# Patient Record
Sex: Male | Born: 1940 | Race: White | Hispanic: No | Marital: Married | Smoking: Former smoker
Health system: Southern US, Community
[De-identification: ages and names within clinical notes are randomized; demographics above are authoritative.]

## PROBLEM LIST (undated history)

## (undated) DIAGNOSIS — F32A Depression, unspecified: Secondary | ICD-10-CM

## (undated) DIAGNOSIS — N189 Chronic kidney disease, unspecified: Secondary | ICD-10-CM

## (undated) DIAGNOSIS — K912 Postsurgical malabsorption, not elsewhere classified: Secondary | ICD-10-CM

## (undated) DIAGNOSIS — N390 Urinary tract infection, site not specified: Secondary | ICD-10-CM

## (undated) DIAGNOSIS — J449 Chronic obstructive pulmonary disease, unspecified: Secondary | ICD-10-CM

## (undated) DIAGNOSIS — E876 Hypokalemia: Secondary | ICD-10-CM

## (undated) DIAGNOSIS — F0390 Unspecified dementia without behavioral disturbance: Secondary | ICD-10-CM

## (undated) DIAGNOSIS — Z85038 Personal history of other malignant neoplasm of large intestine: Secondary | ICD-10-CM

## (undated) DIAGNOSIS — N179 Acute kidney failure, unspecified: Secondary | ICD-10-CM

## (undated) DIAGNOSIS — F329 Major depressive disorder, single episode, unspecified: Secondary | ICD-10-CM

## (undated) HISTORY — DX: Chronic obstructive pulmonary disease, unspecified: J44.9

## (undated) HISTORY — DX: Depression, unspecified: F32.A

## (undated) HISTORY — DX: Hypokalemia: E87.6

## (undated) HISTORY — DX: Major depressive disorder, single episode, unspecified: F32.9

## (undated) HISTORY — DX: Postsurgical malabsorption, not elsewhere classified: K91.2

## (undated) HISTORY — PX: INGUINAL HERNIA REPAIR: SHX194

## (undated) HISTORY — DX: Chronic kidney disease, unspecified: N18.9

## (undated) HISTORY — PX: HEMICOLECTOMY: SHX854

## (undated) HISTORY — PX: SHOULDER SURGERY: SHX246

## (undated) HISTORY — DX: Acute kidney failure, unspecified: N17.9

## (undated) HISTORY — DX: Personal history of other malignant neoplasm of large intestine: Z85.038

## (undated) HISTORY — PX: PORTACATH PLACEMENT: SHX2246

## (undated) HISTORY — PX: OTHER SURGICAL HISTORY: SHX169

## (undated) HISTORY — DX: Urinary tract infection, site not specified: N39.0

## (undated) HISTORY — DX: Unspecified dementia, unspecified severity, without behavioral disturbance, psychotic disturbance, mood disturbance, and anxiety: F03.90

---

## 2015-08-23 DIAGNOSIS — K591 Functional diarrhea: Secondary | ICD-10-CM | POA: Diagnosis not present

## 2015-08-23 DIAGNOSIS — C189 Malignant neoplasm of colon, unspecified: Secondary | ICD-10-CM | POA: Diagnosis not present

## 2015-09-07 DIAGNOSIS — Z933 Colostomy status: Secondary | ICD-10-CM | POA: Diagnosis not present

## 2015-09-27 DIAGNOSIS — Z9181 History of falling: Secondary | ICD-10-CM | POA: Diagnosis not present

## 2015-09-27 DIAGNOSIS — J42 Unspecified chronic bronchitis: Secondary | ICD-10-CM | POA: Diagnosis not present

## 2015-09-27 DIAGNOSIS — Z1389 Encounter for screening for other disorder: Secondary | ICD-10-CM | POA: Diagnosis not present

## 2015-09-27 DIAGNOSIS — M549 Dorsalgia, unspecified: Secondary | ICD-10-CM | POA: Diagnosis not present

## 2015-10-23 DIAGNOSIS — J209 Acute bronchitis, unspecified: Secondary | ICD-10-CM | POA: Diagnosis not present

## 2015-10-24 DIAGNOSIS — R0600 Dyspnea, unspecified: Secondary | ICD-10-CM | POA: Diagnosis not present

## 2015-10-24 DIAGNOSIS — R1110 Vomiting, unspecified: Secondary | ICD-10-CM | POA: Diagnosis not present

## 2015-10-24 DIAGNOSIS — E860 Dehydration: Secondary | ICD-10-CM | POA: Diagnosis not present

## 2015-11-01 DIAGNOSIS — Z933 Colostomy status: Secondary | ICD-10-CM | POA: Diagnosis not present

## 2015-12-25 DIAGNOSIS — M549 Dorsalgia, unspecified: Secondary | ICD-10-CM | POA: Diagnosis not present

## 2015-12-30 DIAGNOSIS — K5900 Constipation, unspecified: Secondary | ICD-10-CM | POA: Diagnosis not present

## 2015-12-30 DIAGNOSIS — N201 Calculus of ureter: Secondary | ICD-10-CM | POA: Diagnosis not present

## 2015-12-30 DIAGNOSIS — N132 Hydronephrosis with renal and ureteral calculous obstruction: Secondary | ICD-10-CM | POA: Diagnosis not present

## 2015-12-30 DIAGNOSIS — Z933 Colostomy status: Secondary | ICD-10-CM | POA: Diagnosis not present

## 2016-01-05 DIAGNOSIS — Z933 Colostomy status: Secondary | ICD-10-CM | POA: Diagnosis not present

## 2016-01-05 DIAGNOSIS — Z9181 History of falling: Secondary | ICD-10-CM | POA: Diagnosis not present

## 2016-01-05 DIAGNOSIS — N200 Calculus of kidney: Secondary | ICD-10-CM | POA: Diagnosis not present

## 2016-01-31 DIAGNOSIS — J069 Acute upper respiratory infection, unspecified: Secondary | ICD-10-CM | POA: Diagnosis not present

## 2016-01-31 DIAGNOSIS — R05 Cough: Secondary | ICD-10-CM | POA: Diagnosis not present

## 2016-02-09 DIAGNOSIS — R079 Chest pain, unspecified: Secondary | ICD-10-CM | POA: Diagnosis not present

## 2016-02-09 DIAGNOSIS — N289 Disorder of kidney and ureter, unspecified: Secondary | ICD-10-CM | POA: Diagnosis not present

## 2016-02-09 DIAGNOSIS — R0602 Shortness of breath: Secondary | ICD-10-CM | POA: Diagnosis not present

## 2016-02-09 DIAGNOSIS — G252 Other specified forms of tremor: Secondary | ICD-10-CM | POA: Diagnosis not present

## 2016-02-09 DIAGNOSIS — R251 Tremor, unspecified: Secondary | ICD-10-CM | POA: Diagnosis not present

## 2016-02-10 DIAGNOSIS — N189 Chronic kidney disease, unspecified: Secondary | ICD-10-CM | POA: Diagnosis not present

## 2016-02-10 DIAGNOSIS — Z23 Encounter for immunization: Secondary | ICD-10-CM | POA: Diagnosis not present

## 2016-02-10 DIAGNOSIS — N179 Acute kidney failure, unspecified: Secondary | ICD-10-CM | POA: Diagnosis not present

## 2016-02-13 DIAGNOSIS — R338 Other retention of urine: Secondary | ICD-10-CM | POA: Diagnosis not present

## 2016-02-13 DIAGNOSIS — N3000 Acute cystitis without hematuria: Secondary | ICD-10-CM | POA: Diagnosis not present

## 2016-02-15 DIAGNOSIS — G301 Alzheimer's disease with late onset: Secondary | ICD-10-CM | POA: Diagnosis not present

## 2016-02-16 DIAGNOSIS — N179 Acute kidney failure, unspecified: Secondary | ICD-10-CM | POA: Diagnosis not present

## 2016-02-20 DIAGNOSIS — N401 Enlarged prostate with lower urinary tract symptoms: Secondary | ICD-10-CM | POA: Diagnosis not present

## 2016-02-20 DIAGNOSIS — N3020 Other chronic cystitis without hematuria: Secondary | ICD-10-CM | POA: Diagnosis not present

## 2016-02-23 DIAGNOSIS — R338 Other retention of urine: Secondary | ICD-10-CM | POA: Diagnosis not present

## 2016-02-23 DIAGNOSIS — Z23 Encounter for immunization: Secondary | ICD-10-CM | POA: Diagnosis not present

## 2016-02-23 DIAGNOSIS — N410 Acute prostatitis: Secondary | ICD-10-CM | POA: Diagnosis not present

## 2016-02-23 DIAGNOSIS — N401 Enlarged prostate with lower urinary tract symptoms: Secondary | ICD-10-CM | POA: Diagnosis not present

## 2016-02-23 DIAGNOSIS — J45909 Unspecified asthma, uncomplicated: Secondary | ICD-10-CM | POA: Diagnosis not present

## 2016-02-24 DIAGNOSIS — Z23 Encounter for immunization: Secondary | ICD-10-CM | POA: Diagnosis not present

## 2016-02-24 DIAGNOSIS — R338 Other retention of urine: Secondary | ICD-10-CM | POA: Diagnosis not present

## 2016-02-24 DIAGNOSIS — N401 Enlarged prostate with lower urinary tract symptoms: Secondary | ICD-10-CM | POA: Diagnosis not present

## 2016-02-24 DIAGNOSIS — J45909 Unspecified asthma, uncomplicated: Secondary | ICD-10-CM | POA: Diagnosis not present

## 2016-02-27 DIAGNOSIS — R338 Other retention of urine: Secondary | ICD-10-CM | POA: Diagnosis not present

## 2016-03-01 DIAGNOSIS — J40 Bronchitis, not specified as acute or chronic: Secondary | ICD-10-CM | POA: Diagnosis not present

## 2016-03-05 DIAGNOSIS — R338 Other retention of urine: Secondary | ICD-10-CM | POA: Diagnosis not present

## 2016-03-05 DIAGNOSIS — N3090 Cystitis, unspecified without hematuria: Secondary | ICD-10-CM | POA: Diagnosis not present

## 2016-03-06 DIAGNOSIS — Z933 Colostomy status: Secondary | ICD-10-CM | POA: Diagnosis not present

## 2016-03-19 DIAGNOSIS — N401 Enlarged prostate with lower urinary tract symptoms: Secondary | ICD-10-CM | POA: Diagnosis not present

## 2016-03-19 DIAGNOSIS — N3000 Acute cystitis without hematuria: Secondary | ICD-10-CM | POA: Diagnosis not present

## 2016-04-17 DIAGNOSIS — R338 Other retention of urine: Secondary | ICD-10-CM | POA: Diagnosis not present

## 2016-05-08 DIAGNOSIS — Z0000 Encounter for general adult medical examination without abnormal findings: Secondary | ICD-10-CM | POA: Diagnosis not present

## 2016-05-08 DIAGNOSIS — R5381 Other malaise: Secondary | ICD-10-CM | POA: Diagnosis not present

## 2016-05-08 DIAGNOSIS — R5383 Other fatigue: Secondary | ICD-10-CM | POA: Diagnosis not present

## 2016-05-08 DIAGNOSIS — R110 Nausea: Secondary | ICD-10-CM | POA: Diagnosis not present

## 2016-06-20 DIAGNOSIS — R110 Nausea: Secondary | ICD-10-CM | POA: Diagnosis not present

## 2016-06-20 DIAGNOSIS — K912 Postsurgical malabsorption, not elsewhere classified: Secondary | ICD-10-CM | POA: Diagnosis not present

## 2016-06-21 DIAGNOSIS — Z85038 Personal history of other malignant neoplasm of large intestine: Secondary | ICD-10-CM | POA: Diagnosis not present

## 2016-06-21 DIAGNOSIS — K912 Postsurgical malabsorption, not elsewhere classified: Secondary | ICD-10-CM | POA: Diagnosis not present

## 2016-06-25 DIAGNOSIS — Z933 Colostomy status: Secondary | ICD-10-CM | POA: Diagnosis not present

## 2016-06-26 DIAGNOSIS — R1111 Vomiting without nausea: Secondary | ICD-10-CM | POA: Diagnosis not present

## 2016-06-26 DIAGNOSIS — J439 Emphysema, unspecified: Secondary | ICD-10-CM | POA: Diagnosis not present

## 2016-06-26 DIAGNOSIS — K5730 Diverticulosis of large intestine without perforation or abscess without bleeding: Secondary | ICD-10-CM | POA: Diagnosis not present

## 2016-06-26 DIAGNOSIS — R100 Acute abdomen: Secondary | ICD-10-CM | POA: Diagnosis not present

## 2016-06-26 DIAGNOSIS — I700 Atherosclerosis of aorta: Secondary | ICD-10-CM | POA: Diagnosis not present

## 2016-06-26 DIAGNOSIS — Z85038 Personal history of other malignant neoplasm of large intestine: Secondary | ICD-10-CM | POA: Diagnosis not present

## 2016-06-28 DIAGNOSIS — E291 Testicular hypofunction: Secondary | ICD-10-CM | POA: Diagnosis not present

## 2016-06-28 DIAGNOSIS — N3000 Acute cystitis without hematuria: Secondary | ICD-10-CM | POA: Diagnosis not present

## 2016-06-28 DIAGNOSIS — N401 Enlarged prostate with lower urinary tract symptoms: Secondary | ICD-10-CM | POA: Diagnosis not present

## 2016-06-28 DIAGNOSIS — R338 Other retention of urine: Secondary | ICD-10-CM | POA: Diagnosis not present

## 2016-06-28 DIAGNOSIS — N3020 Other chronic cystitis without hematuria: Secondary | ICD-10-CM | POA: Diagnosis not present

## 2016-07-12 DIAGNOSIS — N318 Other neuromuscular dysfunction of bladder: Secondary | ICD-10-CM | POA: Diagnosis not present

## 2016-07-12 DIAGNOSIS — N3020 Other chronic cystitis without hematuria: Secondary | ICD-10-CM | POA: Diagnosis not present

## 2016-07-12 DIAGNOSIS — N401 Enlarged prostate with lower urinary tract symptoms: Secondary | ICD-10-CM | POA: Diagnosis not present

## 2016-07-15 DIAGNOSIS — E878 Other disorders of electrolyte and fluid balance, not elsewhere classified: Secondary | ICD-10-CM | POA: Diagnosis not present

## 2016-07-15 DIAGNOSIS — Z452 Encounter for adjustment and management of vascular access device: Secondary | ICD-10-CM | POA: Diagnosis not present

## 2016-07-15 DIAGNOSIS — R0602 Shortness of breath: Secondary | ICD-10-CM | POA: Diagnosis not present

## 2016-07-15 DIAGNOSIS — E872 Acidosis: Secondary | ICD-10-CM | POA: Diagnosis not present

## 2016-07-15 DIAGNOSIS — N390 Urinary tract infection, site not specified: Secondary | ICD-10-CM | POA: Diagnosis not present

## 2016-07-15 DIAGNOSIS — Z79899 Other long term (current) drug therapy: Secondary | ICD-10-CM | POA: Diagnosis not present

## 2016-07-15 DIAGNOSIS — R918 Other nonspecific abnormal finding of lung field: Secondary | ICD-10-CM | POA: Diagnosis not present

## 2016-07-15 DIAGNOSIS — N189 Chronic kidney disease, unspecified: Secondary | ICD-10-CM | POA: Diagnosis not present

## 2016-07-15 DIAGNOSIS — Z85038 Personal history of other malignant neoplasm of large intestine: Secondary | ICD-10-CM | POA: Diagnosis not present

## 2016-07-15 DIAGNOSIS — J449 Chronic obstructive pulmonary disease, unspecified: Secondary | ICD-10-CM | POA: Diagnosis not present

## 2016-07-15 DIAGNOSIS — E860 Dehydration: Secondary | ICD-10-CM | POA: Diagnosis not present

## 2016-07-15 DIAGNOSIS — Z87891 Personal history of nicotine dependence: Secondary | ICD-10-CM | POA: Diagnosis not present

## 2016-07-15 DIAGNOSIS — E876 Hypokalemia: Secondary | ICD-10-CM | POA: Diagnosis not present

## 2016-07-15 DIAGNOSIS — K912 Postsurgical malabsorption, not elsewhere classified: Secondary | ICD-10-CM

## 2016-07-15 DIAGNOSIS — F329 Major depressive disorder, single episode, unspecified: Secondary | ICD-10-CM | POA: Diagnosis not present

## 2016-07-15 DIAGNOSIS — N289 Disorder of kidney and ureter, unspecified: Secondary | ICD-10-CM | POA: Diagnosis not present

## 2016-07-15 DIAGNOSIS — N179 Acute kidney failure, unspecified: Secondary | ICD-10-CM | POA: Diagnosis not present

## 2016-07-15 DIAGNOSIS — F0390 Unspecified dementia without behavioral disturbance: Secondary | ICD-10-CM | POA: Diagnosis not present

## 2016-07-16 DIAGNOSIS — R0602 Shortness of breath: Secondary | ICD-10-CM | POA: Diagnosis not present

## 2016-07-18 DIAGNOSIS — E876 Hypokalemia: Secondary | ICD-10-CM | POA: Diagnosis not present

## 2016-07-18 DIAGNOSIS — N179 Acute kidney failure, unspecified: Secondary | ICD-10-CM | POA: Diagnosis not present

## 2016-07-18 DIAGNOSIS — F0390 Unspecified dementia without behavioral disturbance: Secondary | ICD-10-CM | POA: Diagnosis not present

## 2016-07-18 DIAGNOSIS — F329 Major depressive disorder, single episode, unspecified: Secondary | ICD-10-CM | POA: Diagnosis not present

## 2016-07-18 DIAGNOSIS — N390 Urinary tract infection, site not specified: Secondary | ICD-10-CM | POA: Diagnosis not present

## 2016-07-18 DIAGNOSIS — E872 Acidosis: Secondary | ICD-10-CM | POA: Diagnosis not present

## 2016-07-18 DIAGNOSIS — N289 Disorder of kidney and ureter, unspecified: Secondary | ICD-10-CM | POA: Diagnosis not present

## 2016-07-20 DIAGNOSIS — E860 Dehydration: Secondary | ICD-10-CM | POA: Diagnosis not present

## 2016-07-21 DIAGNOSIS — N189 Chronic kidney disease, unspecified: Secondary | ICD-10-CM | POA: Diagnosis not present

## 2016-07-21 DIAGNOSIS — N179 Acute kidney failure, unspecified: Secondary | ICD-10-CM | POA: Diagnosis not present

## 2016-07-21 DIAGNOSIS — J449 Chronic obstructive pulmonary disease, unspecified: Secondary | ICD-10-CM | POA: Diagnosis not present

## 2016-07-21 DIAGNOSIS — J45909 Unspecified asthma, uncomplicated: Secondary | ICD-10-CM | POA: Diagnosis not present

## 2016-07-21 DIAGNOSIS — Z85038 Personal history of other malignant neoplasm of large intestine: Secondary | ICD-10-CM | POA: Diagnosis not present

## 2016-07-21 DIAGNOSIS — K912 Postsurgical malabsorption, not elsewhere classified: Secondary | ICD-10-CM | POA: Diagnosis not present

## 2016-07-21 DIAGNOSIS — E876 Hypokalemia: Secondary | ICD-10-CM | POA: Diagnosis not present

## 2016-07-21 DIAGNOSIS — N289 Disorder of kidney and ureter, unspecified: Secondary | ICD-10-CM | POA: Diagnosis not present

## 2016-07-21 DIAGNOSIS — Z932 Ileostomy status: Secondary | ICD-10-CM | POA: Diagnosis not present

## 2016-07-21 DIAGNOSIS — E872 Acidosis: Secondary | ICD-10-CM | POA: Diagnosis not present

## 2016-07-21 DIAGNOSIS — Z452 Encounter for adjustment and management of vascular access device: Secondary | ICD-10-CM | POA: Diagnosis not present

## 2016-07-21 DIAGNOSIS — I129 Hypertensive chronic kidney disease with stage 1 through stage 4 chronic kidney disease, or unspecified chronic kidney disease: Secondary | ICD-10-CM | POA: Diagnosis not present

## 2016-07-25 DIAGNOSIS — Z932 Ileostomy status: Secondary | ICD-10-CM | POA: Diagnosis not present

## 2016-07-25 DIAGNOSIS — Z452 Encounter for adjustment and management of vascular access device: Secondary | ICD-10-CM | POA: Diagnosis not present

## 2016-07-25 DIAGNOSIS — N189 Chronic kidney disease, unspecified: Secondary | ICD-10-CM | POA: Diagnosis not present

## 2016-07-25 DIAGNOSIS — E876 Hypokalemia: Secondary | ICD-10-CM | POA: Diagnosis not present

## 2016-07-25 DIAGNOSIS — I129 Hypertensive chronic kidney disease with stage 1 through stage 4 chronic kidney disease, or unspecified chronic kidney disease: Secondary | ICD-10-CM | POA: Diagnosis not present

## 2016-07-25 DIAGNOSIS — Z85038 Personal history of other malignant neoplasm of large intestine: Secondary | ICD-10-CM | POA: Diagnosis not present

## 2016-07-25 DIAGNOSIS — J45909 Unspecified asthma, uncomplicated: Secondary | ICD-10-CM | POA: Diagnosis not present

## 2016-07-25 DIAGNOSIS — K912 Postsurgical malabsorption, not elsewhere classified: Secondary | ICD-10-CM | POA: Diagnosis not present

## 2016-07-25 DIAGNOSIS — J449 Chronic obstructive pulmonary disease, unspecified: Secondary | ICD-10-CM | POA: Diagnosis not present

## 2016-07-25 DIAGNOSIS — K9089 Other intestinal malabsorption: Secondary | ICD-10-CM | POA: Diagnosis not present

## 2016-07-27 DIAGNOSIS — Z932 Ileostomy status: Secondary | ICD-10-CM | POA: Diagnosis not present

## 2016-07-27 DIAGNOSIS — E876 Hypokalemia: Secondary | ICD-10-CM | POA: Diagnosis not present

## 2016-07-27 DIAGNOSIS — K912 Postsurgical malabsorption, not elsewhere classified: Secondary | ICD-10-CM | POA: Diagnosis not present

## 2016-07-27 DIAGNOSIS — J449 Chronic obstructive pulmonary disease, unspecified: Secondary | ICD-10-CM | POA: Diagnosis not present

## 2016-07-27 DIAGNOSIS — J45909 Unspecified asthma, uncomplicated: Secondary | ICD-10-CM | POA: Diagnosis not present

## 2016-07-27 DIAGNOSIS — N189 Chronic kidney disease, unspecified: Secondary | ICD-10-CM | POA: Diagnosis not present

## 2016-07-27 DIAGNOSIS — Z85038 Personal history of other malignant neoplasm of large intestine: Secondary | ICD-10-CM | POA: Diagnosis not present

## 2016-07-27 DIAGNOSIS — I129 Hypertensive chronic kidney disease with stage 1 through stage 4 chronic kidney disease, or unspecified chronic kidney disease: Secondary | ICD-10-CM | POA: Diagnosis not present

## 2016-07-27 DIAGNOSIS — J209 Acute bronchitis, unspecified: Secondary | ICD-10-CM | POA: Diagnosis not present

## 2016-07-27 DIAGNOSIS — Z452 Encounter for adjustment and management of vascular access device: Secondary | ICD-10-CM | POA: Diagnosis not present

## 2016-07-28 DIAGNOSIS — N289 Disorder of kidney and ureter, unspecified: Secondary | ICD-10-CM | POA: Diagnosis not present

## 2016-07-28 DIAGNOSIS — K912 Postsurgical malabsorption, not elsewhere classified: Secondary | ICD-10-CM | POA: Diagnosis not present

## 2016-07-28 DIAGNOSIS — E876 Hypokalemia: Secondary | ICD-10-CM | POA: Diagnosis not present

## 2016-07-28 DIAGNOSIS — E872 Acidosis: Secondary | ICD-10-CM | POA: Diagnosis not present

## 2016-07-28 DIAGNOSIS — N179 Acute kidney failure, unspecified: Secondary | ICD-10-CM | POA: Diagnosis not present

## 2016-07-30 DIAGNOSIS — E876 Hypokalemia: Secondary | ICD-10-CM | POA: Diagnosis not present

## 2016-07-30 DIAGNOSIS — I129 Hypertensive chronic kidney disease with stage 1 through stage 4 chronic kidney disease, or unspecified chronic kidney disease: Secondary | ICD-10-CM | POA: Diagnosis not present

## 2016-07-30 DIAGNOSIS — J449 Chronic obstructive pulmonary disease, unspecified: Secondary | ICD-10-CM | POA: Diagnosis not present

## 2016-07-30 DIAGNOSIS — Z85038 Personal history of other malignant neoplasm of large intestine: Secondary | ICD-10-CM | POA: Diagnosis not present

## 2016-07-30 DIAGNOSIS — J45909 Unspecified asthma, uncomplicated: Secondary | ICD-10-CM | POA: Diagnosis not present

## 2016-07-30 DIAGNOSIS — N189 Chronic kidney disease, unspecified: Secondary | ICD-10-CM | POA: Diagnosis not present

## 2016-07-30 DIAGNOSIS — Z932 Ileostomy status: Secondary | ICD-10-CM | POA: Diagnosis not present

## 2016-07-30 DIAGNOSIS — Z452 Encounter for adjustment and management of vascular access device: Secondary | ICD-10-CM | POA: Diagnosis not present

## 2016-07-30 DIAGNOSIS — K912 Postsurgical malabsorption, not elsewhere classified: Secondary | ICD-10-CM | POA: Diagnosis not present

## 2016-07-31 DIAGNOSIS — K912 Postsurgical malabsorption, not elsewhere classified: Secondary | ICD-10-CM | POA: Diagnosis not present

## 2016-07-31 DIAGNOSIS — E876 Hypokalemia: Secondary | ICD-10-CM | POA: Diagnosis not present

## 2016-07-31 DIAGNOSIS — N289 Disorder of kidney and ureter, unspecified: Secondary | ICD-10-CM | POA: Diagnosis not present

## 2016-08-02 DIAGNOSIS — J45909 Unspecified asthma, uncomplicated: Secondary | ICD-10-CM | POA: Diagnosis not present

## 2016-08-02 DIAGNOSIS — K912 Postsurgical malabsorption, not elsewhere classified: Secondary | ICD-10-CM | POA: Diagnosis not present

## 2016-08-02 DIAGNOSIS — J449 Chronic obstructive pulmonary disease, unspecified: Secondary | ICD-10-CM | POA: Diagnosis not present

## 2016-08-02 DIAGNOSIS — Z85038 Personal history of other malignant neoplasm of large intestine: Secondary | ICD-10-CM | POA: Diagnosis not present

## 2016-08-02 DIAGNOSIS — N189 Chronic kidney disease, unspecified: Secondary | ICD-10-CM | POA: Diagnosis not present

## 2016-08-02 DIAGNOSIS — E876 Hypokalemia: Secondary | ICD-10-CM | POA: Diagnosis not present

## 2016-08-02 DIAGNOSIS — Z932 Ileostomy status: Secondary | ICD-10-CM | POA: Diagnosis not present

## 2016-08-02 DIAGNOSIS — Z452 Encounter for adjustment and management of vascular access device: Secondary | ICD-10-CM | POA: Diagnosis not present

## 2016-08-02 DIAGNOSIS — I129 Hypertensive chronic kidney disease with stage 1 through stage 4 chronic kidney disease, or unspecified chronic kidney disease: Secondary | ICD-10-CM | POA: Diagnosis not present

## 2016-08-06 DIAGNOSIS — J449 Chronic obstructive pulmonary disease, unspecified: Secondary | ICD-10-CM | POA: Diagnosis not present

## 2016-08-06 DIAGNOSIS — Z85038 Personal history of other malignant neoplasm of large intestine: Secondary | ICD-10-CM | POA: Diagnosis not present

## 2016-08-06 DIAGNOSIS — Z932 Ileostomy status: Secondary | ICD-10-CM | POA: Diagnosis not present

## 2016-08-06 DIAGNOSIS — Z452 Encounter for adjustment and management of vascular access device: Secondary | ICD-10-CM | POA: Diagnosis not present

## 2016-08-06 DIAGNOSIS — E876 Hypokalemia: Secondary | ICD-10-CM | POA: Diagnosis not present

## 2016-08-06 DIAGNOSIS — K912 Postsurgical malabsorption, not elsewhere classified: Secondary | ICD-10-CM | POA: Diagnosis not present

## 2016-08-06 DIAGNOSIS — N189 Chronic kidney disease, unspecified: Secondary | ICD-10-CM | POA: Diagnosis not present

## 2016-08-06 DIAGNOSIS — J45909 Unspecified asthma, uncomplicated: Secondary | ICD-10-CM | POA: Diagnosis not present

## 2016-08-06 DIAGNOSIS — I129 Hypertensive chronic kidney disease with stage 1 through stage 4 chronic kidney disease, or unspecified chronic kidney disease: Secondary | ICD-10-CM | POA: Diagnosis not present

## 2016-08-13 DIAGNOSIS — K912 Postsurgical malabsorption, not elsewhere classified: Secondary | ICD-10-CM | POA: Diagnosis not present

## 2016-08-20 DIAGNOSIS — K912 Postsurgical malabsorption, not elsewhere classified: Secondary | ICD-10-CM | POA: Diagnosis not present

## 2016-08-27 DIAGNOSIS — K912 Postsurgical malabsorption, not elsewhere classified: Secondary | ICD-10-CM | POA: Diagnosis not present

## 2016-08-30 DIAGNOSIS — D649 Anemia, unspecified: Secondary | ICD-10-CM | POA: Diagnosis not present

## 2016-09-03 DIAGNOSIS — K912 Postsurgical malabsorption, not elsewhere classified: Secondary | ICD-10-CM | POA: Diagnosis not present

## 2016-09-10 DIAGNOSIS — K912 Postsurgical malabsorption, not elsewhere classified: Secondary | ICD-10-CM | POA: Diagnosis not present

## 2016-09-17 DIAGNOSIS — K912 Postsurgical malabsorption, not elsewhere classified: Secondary | ICD-10-CM | POA: Diagnosis not present

## 2016-09-21 DIAGNOSIS — Z933 Colostomy status: Secondary | ICD-10-CM | POA: Diagnosis not present

## 2016-09-24 DIAGNOSIS — K912 Postsurgical malabsorption, not elsewhere classified: Secondary | ICD-10-CM | POA: Diagnosis not present

## 2016-09-27 DIAGNOSIS — K912 Postsurgical malabsorption, not elsewhere classified: Secondary | ICD-10-CM | POA: Diagnosis not present

## 2016-10-01 DIAGNOSIS — K912 Postsurgical malabsorption, not elsewhere classified: Secondary | ICD-10-CM | POA: Diagnosis not present

## 2016-10-02 DIAGNOSIS — I4949 Other premature depolarization: Secondary | ICD-10-CM | POA: Insufficient documentation

## 2016-10-02 DIAGNOSIS — Z85038 Personal history of other malignant neoplasm of large intestine: Secondary | ICD-10-CM | POA: Insufficient documentation

## 2016-10-02 DIAGNOSIS — R5381 Other malaise: Secondary | ICD-10-CM | POA: Insufficient documentation

## 2016-10-02 DIAGNOSIS — K591 Functional diarrhea: Secondary | ICD-10-CM | POA: Insufficient documentation

## 2016-10-02 DIAGNOSIS — J209 Acute bronchitis, unspecified: Secondary | ICD-10-CM | POA: Diagnosis not present

## 2016-10-02 DIAGNOSIS — Z681 Body mass index (BMI) 19 or less, adult: Secondary | ICD-10-CM | POA: Insufficient documentation

## 2016-10-02 DIAGNOSIS — K529 Noninfective gastroenteritis and colitis, unspecified: Secondary | ICD-10-CM | POA: Insufficient documentation

## 2016-10-02 DIAGNOSIS — L989 Disorder of the skin and subcutaneous tissue, unspecified: Secondary | ICD-10-CM | POA: Insufficient documentation

## 2016-10-02 DIAGNOSIS — R1110 Vomiting, unspecified: Secondary | ICD-10-CM | POA: Insufficient documentation

## 2016-10-02 DIAGNOSIS — J42 Unspecified chronic bronchitis: Secondary | ICD-10-CM | POA: Insufficient documentation

## 2016-10-02 DIAGNOSIS — K912 Postsurgical malabsorption, not elsewhere classified: Secondary | ICD-10-CM | POA: Insufficient documentation

## 2016-10-02 DIAGNOSIS — Z9181 History of falling: Secondary | ICD-10-CM | POA: Insufficient documentation

## 2016-10-02 DIAGNOSIS — Z0000 Encounter for general adult medical examination without abnormal findings: Secondary | ICD-10-CM | POA: Insufficient documentation

## 2016-10-02 DIAGNOSIS — N529 Male erectile dysfunction, unspecified: Secondary | ICD-10-CM | POA: Insufficient documentation

## 2016-10-02 DIAGNOSIS — Z8659 Personal history of other mental and behavioral disorders: Secondary | ICD-10-CM | POA: Insufficient documentation

## 2016-10-02 DIAGNOSIS — J0181 Other acute recurrent sinusitis: Secondary | ICD-10-CM | POA: Diagnosis not present

## 2016-10-02 DIAGNOSIS — J189 Pneumonia, unspecified organism: Secondary | ICD-10-CM | POA: Insufficient documentation

## 2016-10-02 DIAGNOSIS — K630 Abscess of intestine: Secondary | ICD-10-CM | POA: Insufficient documentation

## 2016-10-02 DIAGNOSIS — M549 Dorsalgia, unspecified: Secondary | ICD-10-CM | POA: Insufficient documentation

## 2016-10-02 DIAGNOSIS — R7309 Other abnormal glucose: Secondary | ICD-10-CM | POA: Insufficient documentation

## 2016-10-02 DIAGNOSIS — R636 Underweight: Secondary | ICD-10-CM | POA: Insufficient documentation

## 2016-10-02 DIAGNOSIS — F5104 Psychophysiologic insomnia: Secondary | ICD-10-CM | POA: Insufficient documentation

## 2016-10-02 DIAGNOSIS — D649 Anemia, unspecified: Secondary | ICD-10-CM | POA: Insufficient documentation

## 2016-10-02 DIAGNOSIS — I951 Orthostatic hypotension: Secondary | ICD-10-CM | POA: Insufficient documentation

## 2016-10-08 DIAGNOSIS — K912 Postsurgical malabsorption, not elsewhere classified: Secondary | ICD-10-CM | POA: Diagnosis not present

## 2016-10-11 DIAGNOSIS — K912 Postsurgical malabsorption, not elsewhere classified: Secondary | ICD-10-CM | POA: Diagnosis not present

## 2016-10-15 DIAGNOSIS — K912 Postsurgical malabsorption, not elsewhere classified: Secondary | ICD-10-CM | POA: Diagnosis not present

## 2016-10-18 DIAGNOSIS — K912 Postsurgical malabsorption, not elsewhere classified: Secondary | ICD-10-CM | POA: Diagnosis not present

## 2016-10-22 DIAGNOSIS — K912 Postsurgical malabsorption, not elsewhere classified: Secondary | ICD-10-CM | POA: Diagnosis not present

## 2016-10-25 DIAGNOSIS — K912 Postsurgical malabsorption, not elsewhere classified: Secondary | ICD-10-CM | POA: Diagnosis not present

## 2016-10-29 DIAGNOSIS — K912 Postsurgical malabsorption, not elsewhere classified: Secondary | ICD-10-CM | POA: Diagnosis not present

## 2016-10-30 DIAGNOSIS — E785 Hyperlipidemia, unspecified: Secondary | ICD-10-CM | POA: Diagnosis not present

## 2016-10-30 DIAGNOSIS — Z85038 Personal history of other malignant neoplasm of large intestine: Secondary | ICD-10-CM | POA: Diagnosis not present

## 2016-10-30 DIAGNOSIS — Z87891 Personal history of nicotine dependence: Secondary | ICD-10-CM | POA: Diagnosis not present

## 2016-10-30 DIAGNOSIS — J329 Chronic sinusitis, unspecified: Secondary | ICD-10-CM | POA: Diagnosis not present

## 2016-10-30 DIAGNOSIS — J90 Pleural effusion, not elsewhere classified: Secondary | ICD-10-CM | POA: Diagnosis not present

## 2016-10-30 DIAGNOSIS — J449 Chronic obstructive pulmonary disease, unspecified: Secondary | ICD-10-CM | POA: Diagnosis not present

## 2016-10-30 DIAGNOSIS — I129 Hypertensive chronic kidney disease with stage 1 through stage 4 chronic kidney disease, or unspecified chronic kidney disease: Secondary | ICD-10-CM | POA: Diagnosis not present

## 2016-10-30 DIAGNOSIS — J40 Bronchitis, not specified as acute or chronic: Secondary | ICD-10-CM | POA: Diagnosis not present

## 2016-10-30 DIAGNOSIS — N289 Disorder of kidney and ureter, unspecified: Secondary | ICD-10-CM | POA: Diagnosis not present

## 2016-10-30 DIAGNOSIS — Z933 Colostomy status: Secondary | ICD-10-CM | POA: Diagnosis not present

## 2016-10-30 DIAGNOSIS — I509 Heart failure, unspecified: Secondary | ICD-10-CM | POA: Diagnosis not present

## 2016-10-30 DIAGNOSIS — K912 Postsurgical malabsorption, not elsewhere classified: Secondary | ICD-10-CM | POA: Diagnosis not present

## 2016-10-30 DIAGNOSIS — F0390 Unspecified dementia without behavioral disturbance: Secondary | ICD-10-CM | POA: Diagnosis not present

## 2016-10-30 DIAGNOSIS — R918 Other nonspecific abnormal finding of lung field: Secondary | ICD-10-CM | POA: Diagnosis not present

## 2016-10-30 DIAGNOSIS — E875 Hyperkalemia: Secondary | ICD-10-CM | POA: Diagnosis not present

## 2016-10-30 DIAGNOSIS — Z79899 Other long term (current) drug therapy: Secondary | ICD-10-CM | POA: Diagnosis not present

## 2016-10-30 DIAGNOSIS — R0602 Shortness of breath: Secondary | ICD-10-CM | POA: Diagnosis not present

## 2016-10-30 DIAGNOSIS — N183 Chronic kidney disease, stage 3 (moderate): Secondary | ICD-10-CM | POA: Diagnosis not present

## 2016-10-30 DIAGNOSIS — R7989 Other specified abnormal findings of blood chemistry: Secondary | ICD-10-CM | POA: Diagnosis not present

## 2016-10-30 DIAGNOSIS — R609 Edema, unspecified: Secondary | ICD-10-CM | POA: Diagnosis not present

## 2016-10-30 DIAGNOSIS — R600 Localized edema: Secondary | ICD-10-CM | POA: Diagnosis not present

## 2016-10-30 DIAGNOSIS — I214 Non-ST elevation (NSTEMI) myocardial infarction: Secondary | ICD-10-CM | POA: Diagnosis not present

## 2016-10-30 DIAGNOSIS — I517 Cardiomegaly: Secondary | ICD-10-CM | POA: Diagnosis not present

## 2016-10-30 DIAGNOSIS — R0609 Other forms of dyspnea: Secondary | ICD-10-CM | POA: Diagnosis not present

## 2016-10-31 DIAGNOSIS — E785 Hyperlipidemia, unspecified: Secondary | ICD-10-CM | POA: Diagnosis not present

## 2016-10-31 DIAGNOSIS — R0609 Other forms of dyspnea: Secondary | ICD-10-CM | POA: Diagnosis not present

## 2016-10-31 DIAGNOSIS — I350 Nonrheumatic aortic (valve) stenosis: Secondary | ICD-10-CM | POA: Diagnosis not present

## 2016-10-31 DIAGNOSIS — N183 Chronic kidney disease, stage 3 (moderate): Secondary | ICD-10-CM | POA: Diagnosis not present

## 2016-10-31 DIAGNOSIS — R7989 Other specified abnormal findings of blood chemistry: Secondary | ICD-10-CM | POA: Diagnosis not present

## 2016-11-01 DIAGNOSIS — J449 Chronic obstructive pulmonary disease, unspecified: Secondary | ICD-10-CM | POA: Diagnosis not present

## 2016-11-01 DIAGNOSIS — E785 Hyperlipidemia, unspecified: Secondary | ICD-10-CM | POA: Diagnosis not present

## 2016-11-01 DIAGNOSIS — R7989 Other specified abnormal findings of blood chemistry: Secondary | ICD-10-CM | POA: Diagnosis not present

## 2016-11-01 DIAGNOSIS — N183 Chronic kidney disease, stage 3 (moderate): Secondary | ICD-10-CM | POA: Diagnosis not present

## 2016-11-01 DIAGNOSIS — R0609 Other forms of dyspnea: Secondary | ICD-10-CM | POA: Diagnosis not present

## 2016-11-06 DIAGNOSIS — I509 Heart failure, unspecified: Secondary | ICD-10-CM | POA: Diagnosis not present

## 2016-11-06 DIAGNOSIS — E875 Hyperkalemia: Secondary | ICD-10-CM | POA: Diagnosis not present

## 2016-11-06 DIAGNOSIS — N179 Acute kidney failure, unspecified: Secondary | ICD-10-CM | POA: Diagnosis not present

## 2016-11-07 DIAGNOSIS — I509 Heart failure, unspecified: Secondary | ICD-10-CM | POA: Insufficient documentation

## 2016-11-15 DIAGNOSIS — R790 Abnormal level of blood mineral: Secondary | ICD-10-CM | POA: Diagnosis not present

## 2016-11-15 DIAGNOSIS — N179 Acute kidney failure, unspecified: Secondary | ICD-10-CM | POA: Diagnosis not present

## 2016-11-19 DIAGNOSIS — N179 Acute kidney failure, unspecified: Secondary | ICD-10-CM | POA: Diagnosis not present

## 2016-11-19 DIAGNOSIS — E875 Hyperkalemia: Secondary | ICD-10-CM | POA: Diagnosis not present

## 2016-11-26 DIAGNOSIS — Z933 Colostomy status: Secondary | ICD-10-CM | POA: Diagnosis not present

## 2016-12-01 DIAGNOSIS — J449 Chronic obstructive pulmonary disease, unspecified: Secondary | ICD-10-CM | POA: Diagnosis not present

## 2016-12-03 DIAGNOSIS — E876 Hypokalemia: Secondary | ICD-10-CM | POA: Diagnosis not present

## 2016-12-07 DIAGNOSIS — J40 Bronchitis, not specified as acute or chronic: Secondary | ICD-10-CM | POA: Diagnosis not present

## 2016-12-11 DIAGNOSIS — J40 Bronchitis, not specified as acute or chronic: Secondary | ICD-10-CM | POA: Diagnosis not present

## 2016-12-26 DIAGNOSIS — N179 Acute kidney failure, unspecified: Secondary | ICD-10-CM | POA: Diagnosis not present

## 2016-12-26 DIAGNOSIS — B372 Candidiasis of skin and nail: Secondary | ICD-10-CM | POA: Diagnosis not present

## 2016-12-27 DIAGNOSIS — B372 Candidiasis of skin and nail: Secondary | ICD-10-CM | POA: Insufficient documentation

## 2017-01-01 DIAGNOSIS — J449 Chronic obstructive pulmonary disease, unspecified: Secondary | ICD-10-CM | POA: Diagnosis not present

## 2017-01-09 DIAGNOSIS — L309 Dermatitis, unspecified: Secondary | ICD-10-CM | POA: Diagnosis not present

## 2017-01-09 DIAGNOSIS — F0390 Unspecified dementia without behavioral disturbance: Secondary | ICD-10-CM | POA: Insufficient documentation

## 2017-01-09 DIAGNOSIS — F39 Unspecified mood [affective] disorder: Secondary | ICD-10-CM | POA: Insufficient documentation

## 2017-02-01 DIAGNOSIS — J449 Chronic obstructive pulmonary disease, unspecified: Secondary | ICD-10-CM | POA: Diagnosis not present

## 2017-02-01 DIAGNOSIS — N183 Chronic kidney disease, stage 3 (moderate): Secondary | ICD-10-CM | POA: Diagnosis not present

## 2017-02-06 DIAGNOSIS — N289 Disorder of kidney and ureter, unspecified: Secondary | ICD-10-CM | POA: Diagnosis not present

## 2017-02-06 DIAGNOSIS — B079 Viral wart, unspecified: Secondary | ICD-10-CM | POA: Diagnosis not present

## 2017-02-06 DIAGNOSIS — R05 Cough: Secondary | ICD-10-CM | POA: Diagnosis not present

## 2017-02-06 DIAGNOSIS — Z23 Encounter for immunization: Secondary | ICD-10-CM | POA: Diagnosis not present

## 2017-02-06 DIAGNOSIS — I509 Heart failure, unspecified: Secondary | ICD-10-CM | POA: Diagnosis not present

## 2017-02-21 DIAGNOSIS — Z933 Colostomy status: Secondary | ICD-10-CM | POA: Diagnosis not present

## 2017-03-03 DIAGNOSIS — N183 Chronic kidney disease, stage 3 (moderate): Secondary | ICD-10-CM | POA: Diagnosis not present

## 2017-03-03 DIAGNOSIS — J449 Chronic obstructive pulmonary disease, unspecified: Secondary | ICD-10-CM | POA: Diagnosis not present

## 2017-05-05 DIAGNOSIS — K912 Postsurgical malabsorption, not elsewhere classified: Secondary | ICD-10-CM | POA: Diagnosis not present

## 2017-05-05 DIAGNOSIS — R748 Abnormal levels of other serum enzymes: Secondary | ICD-10-CM | POA: Diagnosis not present

## 2017-05-05 DIAGNOSIS — F0390 Unspecified dementia without behavioral disturbance: Secondary | ICD-10-CM

## 2017-05-05 DIAGNOSIS — N179 Acute kidney failure, unspecified: Secondary | ICD-10-CM

## 2017-05-05 DIAGNOSIS — J181 Lobar pneumonia, unspecified organism: Secondary | ICD-10-CM

## 2017-05-05 DIAGNOSIS — R7989 Other specified abnormal findings of blood chemistry: Secondary | ICD-10-CM | POA: Diagnosis not present

## 2017-05-05 DIAGNOSIS — J449 Chronic obstructive pulmonary disease, unspecified: Secondary | ICD-10-CM

## 2017-05-06 DIAGNOSIS — R7989 Other specified abnormal findings of blood chemistry: Secondary | ICD-10-CM | POA: Diagnosis not present

## 2017-05-06 DIAGNOSIS — J449 Chronic obstructive pulmonary disease, unspecified: Secondary | ICD-10-CM | POA: Diagnosis not present

## 2017-05-06 DIAGNOSIS — N179 Acute kidney failure, unspecified: Secondary | ICD-10-CM | POA: Diagnosis not present

## 2017-05-06 DIAGNOSIS — K912 Postsurgical malabsorption, not elsewhere classified: Secondary | ICD-10-CM | POA: Diagnosis not present

## 2017-05-06 DIAGNOSIS — R748 Abnormal levels of other serum enzymes: Secondary | ICD-10-CM | POA: Diagnosis not present

## 2017-05-06 DIAGNOSIS — J181 Lobar pneumonia, unspecified organism: Secondary | ICD-10-CM | POA: Diagnosis not present

## 2017-05-06 DIAGNOSIS — R0602 Shortness of breath: Secondary | ICD-10-CM

## 2017-05-06 DIAGNOSIS — F0390 Unspecified dementia without behavioral disturbance: Secondary | ICD-10-CM | POA: Diagnosis not present

## 2017-05-07 DIAGNOSIS — R748 Abnormal levels of other serum enzymes: Secondary | ICD-10-CM | POA: Diagnosis not present

## 2017-05-07 DIAGNOSIS — J449 Chronic obstructive pulmonary disease, unspecified: Secondary | ICD-10-CM | POA: Diagnosis not present

## 2017-05-07 DIAGNOSIS — N179 Acute kidney failure, unspecified: Secondary | ICD-10-CM | POA: Diagnosis not present

## 2017-05-07 DIAGNOSIS — J181 Lobar pneumonia, unspecified organism: Secondary | ICD-10-CM | POA: Diagnosis not present

## 2017-08-06 ENCOUNTER — Telehealth: Payer: Self-pay | Admitting: Gastroenterology

## 2017-08-06 NOTE — Telephone Encounter (Signed)
Please do How is he doing? Please talk to his daughter as well

## 2017-08-06 NOTE — Telephone Encounter (Signed)
Would you like to give him refills on his Gattex?

## 2017-08-07 NOTE — Telephone Encounter (Signed)
Called patient to get information, waiting for a returned phone call.

## 2017-08-07 NOTE — Telephone Encounter (Signed)
Call and spoke with Hendricks Limes who is the patients nurse for [facility]. She said that she would put in the order and make sure the patient receives his medication. There was an issue with supply and pharmacies weren't able to get it but that issue has since resoled.

## 2017-08-09 ENCOUNTER — Telehealth: Payer: Self-pay | Admitting: Gastroenterology

## 2017-08-09 NOTE — Telephone Encounter (Signed)
Faxed in prescription.

## 2017-08-16 ENCOUNTER — Other Ambulatory Visit: Payer: Self-pay

## 2017-08-16 MED ORDER — TEDUGLUTIDE (RDNA) 5 MG ~~LOC~~ KIT: PACK | SUBCUTANEOUS | 11 refills | Status: DC

## 2017-09-19 ENCOUNTER — Other Ambulatory Visit: Payer: Self-pay

## 2017-10-23 ENCOUNTER — Other Ambulatory Visit: Payer: Self-pay | Admitting: Gastroenterology

## 2018-01-22 DIAGNOSIS — E559 Vitamin D deficiency, unspecified: Secondary | ICD-10-CM | POA: Insufficient documentation

## 2018-02-23 DIAGNOSIS — S0181XA Laceration without foreign body of other part of head, initial encounter: Secondary | ICD-10-CM

## 2018-02-24 DIAGNOSIS — S0181XA Laceration without foreign body of other part of head, initial encounter: Secondary | ICD-10-CM | POA: Diagnosis not present

## 2018-02-25 DIAGNOSIS — S0181XA Laceration without foreign body of other part of head, initial encounter: Secondary | ICD-10-CM | POA: Diagnosis not present

## 2018-03-03 ENCOUNTER — Encounter: Payer: Self-pay | Admitting: Gastroenterology

## 2018-03-07 ENCOUNTER — Ambulatory Visit: Payer: Medicare Other | Admitting: Gastroenterology

## 2018-03-07 ENCOUNTER — Encounter: Payer: Self-pay | Admitting: Gastroenterology

## 2018-03-07 VITALS — BP 106/62 | HR 64 | Ht 67.0 in | Wt 105.2 lb

## 2018-03-07 DIAGNOSIS — Z85038 Personal history of other malignant neoplasm of large intestine: Secondary | ICD-10-CM | POA: Diagnosis not present

## 2018-03-07 DIAGNOSIS — K912 Postsurgical malabsorption, not elsewhere classified: Secondary | ICD-10-CM | POA: Diagnosis not present

## 2018-03-07 DIAGNOSIS — K90829 Short bowel syndrome, unspecified: Secondary | ICD-10-CM

## 2018-03-07 MED ORDER — PROMETHAZINE HCL 25 MG PO TABS: 25.0000 mg | ORAL_TABLET | Freq: Three times a day (TID) | ORAL | 6 refills | Status: DC | PRN

## 2018-03-07 MED ORDER — ONDANSETRON 4 MG PO TBDP: 4.0000 mg | ORAL_TABLET | Freq: Four times a day (QID) | ORAL | 6 refills | Status: DC | PRN

## 2018-03-07 NOTE — Patient Instructions (Signed)
If you are age 78 or older, your body mass index should be between 23-30. Your Body mass index is 16.48 kg/m. If this is out of the aforementioned range listed, please consider follow up with your Primary Care Provider.  If you are age 74 or younger, your body mass index should be between 19-25. Your Body mass index is 16.48 kg/m. If this is out of the aformentioned range listed, please consider follow up with your Primary Care Provider.    We have sent the following medications to your pharmacy for you to pick up at your convenience: Zofran Phenergan  Thank you,  Dr. Jackquline Denmark

## 2018-03-07 NOTE — Progress Notes (Signed)
Chief Complaint:   Referring Provider:  Myrlene Broker, MD      ASSESSMENT AND PLAN;   #1. Short gut syndrome  #2.  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  Patient with short gut syndrome.  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  Plan: -Continue Gattex 5 mg subcu every day.  1 kit containing 30 injections.  Refills x12 (started 03/19/2017) -Continue cholestyramine 4 g p.o. once a day on as needed basis. -Continue Zofran ODT 4 mg 1 tablet every 6 hours as needed for nausea 30 tablets with 6 refills. -Continue promethazine 25 mg 1 tablet every 8 hours as needed 30 tablets with 6 refills.  Sedation precautions as well. -Follow-up in 6 months.  Earlier, if with any new problems.  HPI:    Timothy Burns is a 77 y.o. male  For follow-up visit Doing very well from GI standpoint Has some nausea which is under good control on PRN Zofran and Phenergan.  He only takes medicine sparingly Recently had a fall and had extensive ecchymosis on the left side of the head Then was found to have pneumonia and UTI-treated with antibiotics.  He has underlying COPD. Doing well currently except for weight loss Has not been eating well while he was on antibiotics.  The appetite is getting better. Tolerating gattex well - has been using cholestyramine only once or twice a month now.  Previously has been using it every day or sometimes twice a day.  Labs reviewed during this visit: Potassium 3.6, sodium 135, creatinine 2.88 normal liver function tests, magnesium 2.4 Past Medical History:  Diagnosis Date  . Acute-on-chronic kidney injury ([place])   . COPD (chronic obstructive pulmonary disease) ([place])   . Dementia ([place])   . Depression   . History of colon  cancer   . Hypokalemia   . Short gut syndrome   . UTI (urinary tract infection)     Past Surgical History:  Procedure Laterality Date  . HEMICOLECTOMY    . INGUINAL HERNIA REPAIR    . PORTACATH PLACEMENT    . Resection of Small Bowel    . SHOULDER SURGERY    . Transuretheral Section of prostate      Family History  Problem Relation Age of Onset  . Esophageal cancer Neg Hx     Social History   Tobacco Use  . Smoking status: Former Research scientist (life sciences)  . Smokeless tobacco: Never Used  Substance Use Topics  . Alcohol use: Not Currently  . Drug use: Not Currently    Current Outpatient Medications  Medication Sig Dispense Refill  . albuterol (PROVENTIL HFA;VENTOLIN HFA) 108 (90 Base) MCG/ACT inhaler Inhale into the lungs as needed.    . cholestyramine light (PREVALITE) 4 GM/DOSE powder Take by mouth as needed.    . donepezil (ARICEPT) 10 MG tablet Take by mouth daily.    Marland Kitchen escitalopram (LEXAPRO) 10 MG tablet Take by mouth daily.    . ferrous sulfate 325 (65 FE) MG tablet Take by mouth daily.    . fluticasone (FLONASE) 50 MCG/ACT nasal spray daily.    . folic acid (FOLVITE) 130 MCG tablet Take by mouth daily.    . furosemide (LASIX) 20 MG tablet Take by mouth daily.    Marland Kitchen HYDROcodone-acetaminophen (NORCO/VICODIN) 5-325 MG tablet as  needed.    . methocarbamol (ROBAXIN) 500 MG tablet as needed.    . ondansetron (ZOFRAN-ODT) 4 MG disintegrating tablet TAKE 1 TABLET BY MOUTH EVERY 6 HOURS AS NEEDED 30 tablet 1  . OXYGEN Inhale into the lungs as needed.    . promethazine (PHENERGAN) 25 MG tablet Take by mouth as needed.    Marland Kitchen SPIRIVA HANDIHALER 18 MCG inhalation capsule as needed.    . Teduglutide, rDNA, (GATTEX) 5 MG KIT Inject 1.2 mg sq daily 30 kit 11  . traZODone (DESYREL) 50 MG tablet Take by mouth as needed.    . Vitamin D, Ergocalciferol, (DRISDOL) 50000 units CAPS capsule Take by mouth once a week.     No current facility-administered medications for this visit.     Not on  File  Review of Systems:  Negative except for HPI     Physical Exam:    BP 106/62   Pulse 64   Ht '5\' 7"'  (1.702 m)   Wt 105 lb 4 oz (47.7 kg)   BMI 16.48 kg/m  Filed Weights   03/07/18 1513  Weight: 105 lb 4 oz (47.7 kg)   Constitutional:  Well-developed, in no acute distress. Has bruise/old hematoma left forehead Psychiatric: Normal mood and affect. Behavior is normal. HEENT: Pupils normal.  Conjunctivae are normal. No scleral icterus. Neck supple.  Cardiovascular: Normal rate, regular rhythm. No edema Pulmonary/chest: Effort normal and breath sounds normal. No wheezing, rales or rhonchi. Abdominal: Soft, nondistended. Nontender. Bowel sounds active throughout. There are no masses palpable. No hepatomegaly.  Has ileostomy. Rectal:  defered Neurological: Alert and oriented to person place and time. Skin: Skin is warm and dry. No rashes noted. 25 minutes spent with the patient today. Greater than 50% was spent in counseling and coordination of care with the patient    Carmell Austria, MD 03/07/2018, 3:34 PM  Cc: Myrlene Broker, MD

## 2018-04-21 ENCOUNTER — Telehealth: Payer: Self-pay | Admitting: Gastroenterology

## 2018-04-21 NOTE — Telephone Encounter (Signed)
Contacted [employer]-awaiting paperwork being sent to complete PA for patient;

## 2018-04-21 NOTE — Telephone Encounter (Signed)
This is a specialty medication, Bri can you help with this.

## 2018-04-22 NOTE — Telephone Encounter (Signed)
Called and spoke with pharmacy personnel Lindsay-informed her of Gattex approval and approval letter was then faxed to pharmacy per Lindsay's request;

## 2018-04-22 NOTE — Telephone Encounter (Signed)
Faxed required paper work for Timothy Burns to [employer]; awaiting response from insurance at this time;

## 2018-06-04 ENCOUNTER — Telehealth: Payer: Self-pay | Admitting: Gastroenterology

## 2018-06-04 ENCOUNTER — Encounter: Payer: Self-pay | Admitting: Gastroenterology

## 2018-06-04 NOTE — Telephone Encounter (Signed)
error 

## 2018-06-05 ENCOUNTER — Telehealth: Payer: Self-pay | Admitting: Gastroenterology

## 2018-06-05 NOTE — Telephone Encounter (Signed)
Pt daughter call stating she was to call you back today. [phone]

## 2018-06-05 NOTE — Telephone Encounter (Signed)
Duplicate documentation concerning patient

## 2018-06-06 NOTE — Telephone Encounter (Signed)
Called and spoke with Stanton Kidney [facility] rep); RN was informed that the patient was approved for Gattex, however, his deductible (new year) was around $4,000.00 and she would be working on obtain patient assistance funds for this patient-Mary reports she will contact the patient with updated information as it becomes available; Mary verbalized understanding of information/instructions; Stanton Kidney was advised to call back if questions/concerns arise;

## 2018-09-18 ENCOUNTER — Encounter: Payer: Self-pay | Admitting: Gastroenterology

## 2018-09-18 DIAGNOSIS — N179 Acute kidney failure, unspecified: Secondary | ICD-10-CM

## 2018-09-18 DIAGNOSIS — J189 Pneumonia, unspecified organism: Secondary | ICD-10-CM

## 2018-09-18 DIAGNOSIS — K921 Melena: Secondary | ICD-10-CM

## 2018-09-18 DIAGNOSIS — J449 Chronic obstructive pulmonary disease, unspecified: Secondary | ICD-10-CM

## 2018-09-18 DIAGNOSIS — E860 Dehydration: Secondary | ICD-10-CM

## 2018-09-18 DIAGNOSIS — F0390 Unspecified dementia without behavioral disturbance: Secondary | ICD-10-CM

## 2018-09-19 DIAGNOSIS — N179 Acute kidney failure, unspecified: Secondary | ICD-10-CM | POA: Diagnosis not present

## 2018-09-19 DIAGNOSIS — J189 Pneumonia, unspecified organism: Secondary | ICD-10-CM | POA: Diagnosis not present

## 2018-09-19 DIAGNOSIS — J449 Chronic obstructive pulmonary disease, unspecified: Secondary | ICD-10-CM | POA: Diagnosis not present

## 2018-09-20 DIAGNOSIS — N179 Acute kidney failure, unspecified: Secondary | ICD-10-CM | POA: Diagnosis not present

## 2018-09-20 DIAGNOSIS — J449 Chronic obstructive pulmonary disease, unspecified: Secondary | ICD-10-CM | POA: Diagnosis not present

## 2018-09-20 DIAGNOSIS — J189 Pneumonia, unspecified organism: Secondary | ICD-10-CM | POA: Diagnosis not present

## 2018-10-08 DIAGNOSIS — N179 Acute kidney failure, unspecified: Secondary | ICD-10-CM | POA: Insufficient documentation

## 2018-11-14 ENCOUNTER — Inpatient Hospital Stay
Admission: AD | Admit: 2018-11-14 | Payer: Medicare Other | Source: Other Acute Inpatient Hospital | Admitting: Internal Medicine

## 2018-11-14 DIAGNOSIS — R55 Syncope and collapse: Secondary | ICD-10-CM | POA: Insufficient documentation

## 2018-11-14 DIAGNOSIS — Z933 Colostomy status: Secondary | ICD-10-CM | POA: Insufficient documentation

## 2018-11-14 DIAGNOSIS — M25522 Pain in left elbow: Secondary | ICD-10-CM | POA: Insufficient documentation

## 2018-11-14 DIAGNOSIS — U071 COVID-19: Secondary | ICD-10-CM | POA: Insufficient documentation

## 2018-11-24 ENCOUNTER — Inpatient Hospital Stay (HOSPITAL_COMMUNITY)
Admission: AD | Admit: 2018-11-24 | Discharge: 2018-11-25 | DRG: 177 | Disposition: A | Discharge status: Home Health | Payer: Medicare Other | Source: Other Acute Inpatient Hospital | Attending: Internal Medicine | Admitting: Internal Medicine

## 2018-11-24 ENCOUNTER — Other Ambulatory Visit: Payer: Self-pay

## 2018-11-24 ENCOUNTER — Encounter (HOSPITAL_COMMUNITY): Payer: Self-pay

## 2018-11-24 DIAGNOSIS — R531 Weakness: Secondary | ICD-10-CM

## 2018-11-24 DIAGNOSIS — J440 Chronic obstructive pulmonary disease with acute lower respiratory infection: Secondary | ICD-10-CM | POA: Diagnosis present

## 2018-11-24 DIAGNOSIS — Z681 Body mass index (BMI) 19 or less, adult: Secondary | ICD-10-CM | POA: Diagnosis not present

## 2018-11-24 DIAGNOSIS — J1282 Pneumonia due to coronavirus disease 2019: Secondary | ICD-10-CM | POA: Diagnosis present

## 2018-11-24 DIAGNOSIS — Z85038 Personal history of other malignant neoplasm of large intestine: Secondary | ICD-10-CM | POA: Diagnosis not present

## 2018-11-24 DIAGNOSIS — N184 Chronic kidney disease, stage 4 (severe): Secondary | ICD-10-CM | POA: Diagnosis not present

## 2018-11-24 DIAGNOSIS — I129 Hypertensive chronic kidney disease with stage 1 through stage 4 chronic kidney disease, or unspecified chronic kidney disease: Secondary | ICD-10-CM | POA: Diagnosis not present

## 2018-11-24 DIAGNOSIS — K912 Postsurgical malabsorption, not elsewhere classified: Secondary | ICD-10-CM | POA: Diagnosis not present

## 2018-11-24 DIAGNOSIS — Z87891 Personal history of nicotine dependence: Secondary | ICD-10-CM

## 2018-11-24 DIAGNOSIS — J9611 Chronic respiratory failure with hypoxia: Secondary | ICD-10-CM | POA: Diagnosis present

## 2018-11-24 DIAGNOSIS — Z7951 Long term (current) use of inhaled steroids: Secondary | ICD-10-CM

## 2018-11-24 DIAGNOSIS — U071 COVID-19: Secondary | ICD-10-CM | POA: Diagnosis not present

## 2018-11-24 DIAGNOSIS — F0390 Unspecified dementia without behavioral disturbance: Secondary | ICD-10-CM | POA: Diagnosis present

## 2018-11-24 DIAGNOSIS — Z79899 Other long term (current) drug therapy: Secondary | ICD-10-CM | POA: Diagnosis not present

## 2018-11-24 DIAGNOSIS — E860 Dehydration: Secondary | ICD-10-CM | POA: Diagnosis not present

## 2018-11-24 DIAGNOSIS — E46 Unspecified protein-calorie malnutrition: Secondary | ICD-10-CM | POA: Diagnosis not present

## 2018-11-24 DIAGNOSIS — N183 Chronic kidney disease, stage 3 unspecified: Secondary | ICD-10-CM | POA: Diagnosis present

## 2018-11-24 DIAGNOSIS — R64 Cachexia: Secondary | ICD-10-CM | POA: Diagnosis not present

## 2018-11-24 DIAGNOSIS — J1289 Other viral pneumonia: Secondary | ICD-10-CM | POA: Diagnosis not present

## 2018-11-24 DIAGNOSIS — R630 Anorexia: Secondary | ICD-10-CM | POA: Diagnosis present

## 2018-11-24 DIAGNOSIS — Z933 Colostomy status: Secondary | ICD-10-CM | POA: Diagnosis not present

## 2018-11-24 DIAGNOSIS — A09 Infectious gastroenteritis and colitis, unspecified: Secondary | ICD-10-CM | POA: Diagnosis present

## 2018-11-24 MED ORDER — ADULT MULTIVITAMIN W/MINERALS CH
1.0000 | ORAL_TABLET | Freq: Every day | ORAL | Status: DC
  Administered 2018-11-24 – 2018-11-25 (×2): 1 via ORAL
  Filled 2018-11-24 (×2): qty 1

## 2018-11-24 MED ORDER — BISACODYL 10 MG RE SUPP: 10.0000 mg | Freq: Every day | RECTAL | Status: DC | PRN

## 2018-11-24 MED ORDER — ONDANSETRON HCL 4 MG PO TABS: 4.0000 mg | ORAL_TABLET | Freq: Four times a day (QID) | ORAL | Status: DC | PRN

## 2018-11-24 MED ORDER — ONDANSETRON HCL 4 MG/2ML IJ SOLN
4.0000 mg | Freq: Four times a day (QID) | INTRAMUSCULAR | Status: DC | PRN
  Administered 2018-11-24: 19:00:00 4 mg via INTRAVENOUS
  Filled 2018-11-24: qty 2

## 2018-11-24 MED ORDER — FERROUS SULFATE 325 (65 FE) MG PO TABS
325.0000 mg | ORAL_TABLET | Freq: Every day | ORAL | Status: DC
  Administered 2018-11-25: 08:00:00 325 mg via ORAL
  Filled 2018-11-24 (×2): qty 1

## 2018-11-24 MED ORDER — TRAMADOL HCL 50 MG PO TABS: 50.0000 mg | ORAL_TABLET | Freq: Four times a day (QID) | ORAL | Status: DC | PRN

## 2018-11-24 MED ORDER — FLUTICASONE PROPIONATE 50 MCG/ACT NA SUSP
1.0000 | Freq: Every day | NASAL | Status: DC
  Administered 2018-11-24 – 2018-11-25 (×2): 1 via NASAL
  Filled 2018-11-24: qty 16

## 2018-11-24 MED ORDER — ENOXAPARIN SODIUM 40 MG/0.4ML ~~LOC~~ SOLN: 40.0000 mg | SUBCUTANEOUS | Status: DC

## 2018-11-24 MED ORDER — HEPARIN SODIUM (PORCINE) 5000 UNIT/ML IJ SOLN
5000.0000 [IU] | Freq: Three times a day (TID) | INTRAMUSCULAR | Status: DC
  Administered 2018-11-24 – 2018-11-25 (×3): 5000 [IU] via SUBCUTANEOUS
  Filled 2018-11-24 (×3): qty 1

## 2018-11-24 MED ORDER — ACETAMINOPHEN 325 MG PO TABS: 650.0000 mg | ORAL_TABLET | Freq: Four times a day (QID) | ORAL | Status: DC | PRN

## 2018-11-24 MED ORDER — LORAZEPAM 0.5 MG PO TABS: 0.5000 mg | ORAL_TABLET | Freq: Four times a day (QID) | ORAL | Status: DC | PRN

## 2018-11-24 MED ORDER — DONEPEZIL HCL 10 MG PO TABS: 10.0000 mg | ORAL_TABLET | Freq: Every day | ORAL | Status: DC

## 2018-11-24 MED ORDER — ALBUTEROL SULFATE HFA 108 (90 BASE) MCG/ACT IN AERS
1.0000 | INHALATION_SPRAY | RESPIRATORY_TRACT | Status: DC | PRN
  Administered 2018-11-24: 14:00:00 1 via RESPIRATORY_TRACT
  Filled 2018-11-24: qty 6.7

## 2018-11-24 MED ORDER — TEDUGLUTIDE (RDNA) 5 MG ~~LOC~~ KIT
1.2000 mg | PACK | SUBCUTANEOUS | Status: DC
  Administered 2018-11-24: 19:00:00 1.2 mg via SUBCUTANEOUS

## 2018-11-24 MED ORDER — ESCITALOPRAM OXALATE 10 MG PO TABS
10.0000 mg | ORAL_TABLET | Freq: Every day | ORAL | Status: DC
  Administered 2018-11-24 – 2018-11-25 (×2): 10 mg via ORAL
  Filled 2018-11-24 (×2): qty 1

## 2018-11-24 MED ORDER — SENNA 8.6 MG PO TABS
1.0000 | ORAL_TABLET | Freq: Two times a day (BID) | ORAL | Status: DC
  Administered 2018-11-24: 8.6 mg via ORAL
  Filled 2018-11-24: qty 1

## 2018-11-24 NOTE — H&P (Signed)
History and Physical    Timothy Burns EPP:[id number] DOB: [birth year] DOA: 11/24/2018  PCP: Myrlene Broker, MD Patient coming from: [facility] ED   Chief Complaint: SOB  HPI:  [phone] w/ a hx of COPD, HTN, Dementia, and CKD who presented to [facility] ED 7/18 w/ c/o SOB. He was admitted to [facility] 7/10 > 7/17 after a syncopal spell at which time he was found to be COVID positive.  His syncope was ultimately felt to be related to micturition in the setting of dehydration.  He was treated for COVID pneumonitis with steroids only.  He was discharged on 7/17 with stable vitals, though the patient himself says he was not ready to be discharged home.  After returning home the patient reports that he had very poor appetite and very little intake.  He feels severely weak in general.  He felt that he was developing some worsening shortness of breath.  As a result the following day, 7/18, he presented again to the [facility] emergency department.  The [facility] ED requested [facility] to either [facility] or [facility] and ultimately a bed became available at [facility] first.  At the time of my exam on 7/20 at [facility] the patient is telling me that he feels he is improved significantly during his stay in the ED at [facility].  He denies being short of breath at this time.  He denies chest pain nausea vomiting headache lightheadedness or dizziness.  He does complain of feeling very weak in general.  He reports intermittent very loose stools but states that this is his baseline given his short bowel syndrome.  He does not feel that he has had a fever.   Significant Events: 7/10 diagnosed w/ COVID - admit to [facility] 7/17 D/C from [facility]  7/18 returned to [facility] ED SOB  7/20 transfered to [facility]  Assessment/Plan  COVID Pneumonia Appears relatively well controlled at this time - no indication presently for Remdesivir or Actemra  COPD - chronic hypoxic respiratory failure  Appears relatively stable at home 2 L nasal  cannula -monitor closely  CKD Stage 4  Baseline crt ~2.6 -recheck renal function in a.m. -gently hydrate  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   Dementia  Appears to be relatively mild at this time  General deconditioned status The patient is cachectic in appearance and very weak -I suspect that he is no longer able to care for himself in a private home -I will ask PT and OT to see him -at present I do not feel there is a safe environment into which to discharge him   DVT prophylaxis: Subcutaneous heparin Code Status: Full Family Communication: None Disposition Plan: PT/OT -gently hydrate -follow-up chest x-ray -follow O2 sats Consults called: None  Review of Systems: As per HPI otherwise 10 point review of systems negative.   Past Medical History:  Diagnosis Date  . Acute-on-chronic kidney injury ([place])   . COPD (chronic obstructive pulmonary disease) ([place])   . Dementia ([place])   . Depression   . History of colon cancer   . Hypokalemia   . Short gut syndrome   . UTI (urinary tract infection)     Past Surgical History:  Procedure Laterality Date  . HEMICOLECTOMY    . INGUINAL HERNIA REPAIR    . PORTACATH PLACEMENT    . Resection of Small Bowel    . SHOULDER SURGERY    . Transuretheral Section of prostate  Family History  Family History  Problem Relation Age of Onset  . Esophageal cancer Neg Hx     Social History   reports that he has quit smoking. His smoking use included cigarettes. He has never used smokeless tobacco. He reports previous alcohol use. He reports previous drug use.  Allergies No Known Allergies  Prior to Admission medications   Medication Sig Start Date End Date Taking? Authorizing Provider  albuterol (PROVENTIL HFA;VENTOLIN HFA) 108 (90 Base) MCG/ACT inhaler Inhale into the lungs as needed. 01/08/18  Yes [provider]  cholestyramine light (PREVALITE) 4 GM/DOSE powder Take by mouth  as needed.   Yes [provider]  donepezil (ARICEPT) 10 MG tablet Take by mouth daily. 08/07/16  Yes [provider]  escitalopram (LEXAPRO) 10 MG tablet Take by mouth daily. 11/10/15  Yes [provider]  ferrous sulfate 325 (65 FE) MG tablet Take by mouth daily.   Yes [provider]  fluticasone (FLONASE) 50 MCG/ACT nasal spray daily. 02/20/18  Yes [provider]  folic acid (FOLVITE) 309 MCG tablet Take by mouth daily.   Yes [provider]  OXYGEN Inhale into the lungs as needed.   Yes [provider]  SPIRIVA HANDIHALER 18 MCG inhalation capsule as needed. 03/03/18  Yes [provider]  Teduglutide, rDNA, (GATTEX) 5 MG KIT Inject 1.2 mg sq daily 08/16/17  Yes Jackquline Denmark, MD  traZODone (DESYREL) 50 MG tablet Take by mouth as needed. 06/19/17 11/24/18 Yes [provider]  Vitamin D, Ergocalciferol, (DRISDOL) 50000 units CAPS capsule Take by mouth once a week. 08/07/17  Yes [provider]  furosemide (LASIX) 20 MG tablet Take by mouth daily. 08/07/17   [provider]  HYDROcodone-acetaminophen (NORCO/VICODIN) 5-325 MG tablet as needed. 12/03/14   [provider]  methocarbamol (ROBAXIN) 500 MG tablet as needed. 01/31/15   [provider]  ondansetron (ZOFRAN-ODT) 4 MG disintegrating tablet Take 1 tablet (4 mg total) by mouth every 6 (six) hours as needed. 03/07/18   Jackquline Denmark, MD  promethazine (PHENERGAN) 25 MG tablet Take 1 tablet (25 mg total) by mouth every 8 (eight) hours as needed. 03/07/18   Jackquline Denmark, MD    Physical Exam: Vitals:   11/24/18 1446 11/24/18 1500 11/24/18 1600 11/24/18 1700  BP: 137/69 130/67 128/70 107/69  Pulse: 82 77 80 83  Resp: _0 (!) 31  Temp: 98 F (36.7 C)   98 F (36.7 C)  TempSrc: Oral     SpO2: 98% 99% 100% 96%  Weight:      Height:        Constitutional: NAD, calm, comfortable -cachectic/malnourished in appearance -very weak in  general Eyes: PERRL, lids and conjunctivae normal ENMT: Mucous membranes are dry Neck: normal, supple, no masses, no thyromegaly -right chest Port-A-Cath evident Respiratory: clear to auscultation bilaterally, faint diffuse crackles with no active wheezing Cardiovascular: Regular rate and rhythm, no murmurs / rubs / gallops. No extremity edema. 2+ pedal pulses. No carotid bruits.  Abdomen: no tenderness, no masses palpated. No hepatosplenomegaly. Bowel sounds positive.  Lower quadrant ostomy with green liquid stool in collection bag. Musculoskeletal: no clubbing / cyanosis. No joint deformity upper and lower extremities. Good ROM, no contractures. Normal muscle tone.  Skin: no rashes, lesions, ulcers. No induration Neurologic: CN 2-12 grossly intact. Sensation intact, DTR normal. Strength 4/5 in all 4.  Psychiatric: Normal judgment and insight. Alert and oriented x 3. Normal mood.    Labs on Admission:  No admission labs are available at the time of this evaluation.  I have reviewed the labs collected at the time of the patient's stay in the [facility] ED.   Cherene Altes, MD [facility] Office  [phone] Pager - Text Page per Amion as per below:  On-Call/Text Page:      [email or web address]  If 7PM-7AM, please contact night-coverage [email or web address] 11/24/2018, 6:57 PM

## 2018-11-25 ENCOUNTER — Inpatient Hospital Stay (HOSPITAL_COMMUNITY): Payer: Medicare Other

## 2018-11-25 DIAGNOSIS — U071 COVID-19: Secondary | ICD-10-CM | POA: Diagnosis not present

## 2018-11-25 DIAGNOSIS — A09 Infectious gastroenteritis and colitis, unspecified: Secondary | ICD-10-CM | POA: Diagnosis not present

## 2018-11-25 DIAGNOSIS — N183 Chronic kidney disease, stage 3 (moderate): Secondary | ICD-10-CM

## 2018-11-25 DIAGNOSIS — R531 Weakness: Secondary | ICD-10-CM

## 2018-11-25 LAB — COMPREHENSIVE METABOLIC PANEL
ALT: 27 U/L (ref 0–44)
AST: 19 U/L (ref 15–41)
Albumin: 2.6 g/dL — ABNORMAL LOW (ref 3.5–5.0)
Alkaline Phosphatase: 71 U/L (ref 38–126)
Anion gap: 11 (ref 5–15)
BUN: 33 mg/dL — ABNORMAL HIGH (ref 8–23)
CO2: 14 mmol/L — ABNORMAL LOW (ref 22–32)
Calcium: 7.8 mg/dL — ABNORMAL LOW (ref 8.9–10.3)
Chloride: 118 mmol/L — ABNORMAL HIGH (ref 98–111)
Creatinine, Ser: 2.35 mg/dL — ABNORMAL HIGH (ref 0.61–1.24)
GFR calc Af Amer: 30 mL/min — ABNORMAL LOW (ref >60)
GFR calc non Af Amer: 26 mL/min — ABNORMAL LOW (ref >60)
Glucose, Bld: 82 mg/dL (ref 70–99)
Potassium: 3.2 mmol/L — ABNORMAL LOW (ref 3.5–5.1)
Sodium: 143 mmol/L (ref 135–145)
Total Bilirubin: 0.5 mg/dL (ref 0.3–1.2)
Total Protein: 7 g/dL (ref 6.5–8.1)

## 2018-11-25 LAB — CBC WITH DIFFERENTIAL/PLATELET
Abs Immature Granulocytes: 0.08 10*3/uL — ABNORMAL HIGH (ref 0.00–0.07)
Basophils Absolute: 0 10*3/uL (ref 0.0–0.1)
Basophils Relative: 0 %
Eosinophils Absolute: 0.1 10*3/uL (ref 0.0–0.5)
Eosinophils Relative: 1 %
HCT: 26.1 % — ABNORMAL LOW (ref 39.0–52.0)
Hemoglobin: 8.2 g/dL — ABNORMAL LOW (ref 13.0–17.0)
Immature Granulocytes: 1 %
Lymphocytes Relative: 27 %
Lymphs Abs: 2.1 10*3/uL (ref 0.7–4.0)
MCH: 28 pg (ref 26.0–34.0)
MCHC: 31.4 g/dL (ref 30.0–36.0)
MCV: 89.1 fL (ref 80.0–100.0)
Monocytes Absolute: 0.7 10*3/uL (ref 0.1–1.0)
Monocytes Relative: 9 %
Neutro Abs: 4.8 10*3/uL (ref 1.7–7.7)
Neutrophils Relative %: 62 %
Platelets: 388 10*3/uL (ref 150–400)
RBC: 2.93 MIL/uL — ABNORMAL LOW (ref 4.22–5.81)
RDW: 17.2 % — ABNORMAL HIGH (ref 11.5–15.5)
WBC: 7.8 10*3/uL (ref 4.0–10.5)
nRBC: 0 % (ref 0.0–0.2)

## 2018-11-25 LAB — D-DIMER, QUANTITATIVE: D-Dimer, Quant: 5.29 ug/mL-FEU — ABNORMAL HIGH (ref 0.00–0.50)

## 2018-11-25 LAB — C-REACTIVE PROTEIN: CRP: 2 mg/dL — ABNORMAL HIGH (ref <1.0)

## 2018-11-25 LAB — FERRITIN: Ferritin: 338 ng/mL — ABNORMAL HIGH (ref 24–336)

## 2018-11-25 MED ORDER — POTASSIUM CHLORIDE CRYS ER 20 MEQ PO TBCR
40.0000 meq | EXTENDED_RELEASE_TABLET | Freq: Once | ORAL | Status: AC
  Administered 2018-11-25: 15:00:00 40 meq via ORAL
  Filled 2018-11-25: qty 2

## 2018-11-25 MED ORDER — ACETAMINOPHEN 325 MG PO TABS: 650.0000 mg | ORAL_TABLET | Freq: Four times a day (QID) | ORAL | Status: AC | PRN

## 2018-11-25 MED ORDER — HEPARIN SOD (PORK) LOCK FLUSH 100 UNIT/ML IV SOLN
500.0000 [IU] | Freq: Once | INTRAVENOUS | Status: AC
  Administered 2018-11-25: 500 [IU]
  Filled 2018-11-25: qty 5

## 2018-11-25 NOTE — Evaluation (Signed)
Occupational Therapy Evaluation Patient Details Name: Timothy Burns MRN: [id number] DOB: 1941-01-18 Today's Date: 11/25/2018    History of Present Illness 78yo admitted to [place] 11/24/18 with a hx of COPD, HTN, Dementia, colon Ca/short gut syndrome, and CKD who presented to [facility] ED 7/18 with SOB. He was admitted to [facility] 7/10 > 7/17 after a syncopal spell at which time he was found to be COVID positive.  His syncope was ultimately felt to be related to micturition in the setting of dehydration.  He was treated for COVID pneumonitis with steroids only.  He was discharged on 7/17 with stable vitals, though the patient himself says he was not ready to be discharged home.   Clinical Impression   PTA, pt was living with his wife and was independent. Pt currently performing ADLs and functional mobility at [facility] A level. Pt presenting with decreased activity tolerance as seen by fatigue. Pt reporting he feels weak.SpO2 maintaining >90% on RA throughout. Noting elevated, irregular HR; notified RN. Pt would benefit from further acute OT to facilitate safe dc. Recommend dc to home with HHOT for further OT to optimize safety, independence with ADLs, and return to PLOF.      Follow Up Recommendations  Home health OT;Supervision/Assistance - 24 hour    Equipment Recommendations  None recommended by OT    Recommendations for Other Services PT consult     Precautions / Restrictions Precautions Precautions: Fall Restrictions Weight Bearing Restrictions: No      Mobility Bed Mobility               General bed mobility comments: OOB in recliner on arrival  Transfers Overall transfer level: Needs assistance Equipment used: None Transfers: Sit to/from Stand Sit to Stand: Supervision         General transfer comment: supervision for safety; denied dizziness    Balance Overall balance assessment: Mild deficits observed, not formally tested(appears guarded, no overt  imbalance)                                         ADL either performed or assessed with clinical judgement   ADL Overall ADL's : Needs assistance/impaired Eating/Feeding: Independent;Sitting   Grooming: Min guard;Standing   Upper Body Bathing: Supervision/ safety;Set up;Sitting   Lower Body Bathing: Min guard;Sit to/from stand   Upper Body Dressing : Supervision/safety;Set up;Sitting   Lower Body Dressing: Min guard;Sit to/from stand   Toilet Transfer: Min guard;Ambulation(Simulated to recliner)     Toileting - Clothing Manipulation Details (indicate cue type and reason): Pt managing and emptying his ostomy bag while seated in recliner     Functional mobility during ADLs: Min guard;+2 for safety/equipment General ADL Comments: Pt very motivated to participate in therapy and return home. Pt presenting with decreased activity tolerance as seen by fatigue. SpO2 stable >90% on RA. Noted HR elevating to 130s     Vision Patient Visual Report: Other (comment)(Cataract sx)       Perception     Praxis      Pertinent Vitals/Pain Pain Assessment: No/denies pain     Hand Dominance     Extremity/Trunk Assessment Upper Extremity Assessment Upper Extremity Assessment: Generalized weakness;LUE deficits/detail LUE Deficits / Details: [place] onto left arm with recent syncopal episode. Pt denies pain but noting guard withdrawal of arm to torso. Decreased grasp compared to right   Lower Extremity Assessment Lower Extremity  Assessment: Defer to PT evaluation   Cervical / Trunk Assessment Cervical / Trunk Assessment: Normal(ostomy bag (has had for ~30 years))   Communication Communication Communication: HOH   Cognition Arousal/Alertness: Awake/alert Behavior During Therapy: WFL for tasks assessed/performed Overall Cognitive Status: Within Functional Limits for tasks assessed                                     General Comments  Patient reports  he feels generally weaker than normal, but better than when he came into hospital. Noted HR irreg and increased to 130s x 2 (not during gait however) while seated after walk in room and again when seated after walk in hall.     Exercises     Shoulder Instructions      Home Living Family/patient expects to be discharged to:: Private residence Living Arrangements: Spouse/significant other Available Help at Discharge: Family;Available 24 hours/day Type of Home: House Home Access: Stairs to enter [employer] of Steps: 2 Entrance Stairs-Rails: Right Home Layout: One level     Bathroom Shower/Tub: Teacher, early years/pre: Standard     Home Equipment: Environmental consultant - 2 wheels;Shower seat;Hand held shower head          Prior Functioning/Environment Level of Independence: Independent                 OT Problem List: Decreased strength;Decreased range of motion;Decreased activity tolerance;Impaired balance (sitting and/or standing);Decreased knowledge of use of DME or AE;Decreased knowledge of precautions      OT Treatment/Interventions: Self-care/ADL training;Therapeutic exercise;Energy conservation;DME and/or AE instruction;Therapeutic activities;Patient/family education    OT Goals(Current goals can be found in the care plan section) Acute Rehab OT Goals Patient Stated Goal: return home and regain strength; agrees to HHPT OT Goal Formulation: With patient Time For Goal Achievement: 12/09/18 Potential to Achieve Goals: Good  OT Frequency: Min 2X/week   Barriers to D/C:            Co-evaluation PT/OT/SLP Co-Evaluation/Treatment: Yes Reason for Co-Treatment: For patient/therapist safety;To address functional/ADL transfers PT goals addressed during session: Mobility/safety with mobility;Balance OT goals addressed during session: ADL's and self-care      AM-PAC OT "6 Clicks" Daily Activity     Outcome Measure Help from another person eating meals?:  None Help from another person taking care of personal grooming?: A Little Help from another person toileting, which includes using toliet, bedpan, or urinal?: A Little Help from another person bathing (including washing, rinsing, drying)?: A Little Help from another person to put on and taking off regular upper body clothing?: None Help from another person to put on and taking off regular lower body clothing?: A Little 6 Click Score: 20   End of Session Equipment Utilized During Treatment: Gait belt Nurse Communication: Mobility status  Activity Tolerance: Patient tolerated treatment well Patient left: in chair;with call bell/phone within reach;with chair alarm set  OT Visit Diagnosis: Unsteadiness on feet (R26.81);Other abnormalities of gait and mobility (R26.89);Muscle weakness (generalized) (M62.81)                Time: 0258-5277 OT Time Calculation (min): 25 min Charges:  OT General Charges $OT Visit: 1 Visit OT Evaluation $OT Eval Low Complexity: [place], OTR/L Acute Rehab Pager: [phone] Office: [place] 11/25/2018, 1:07 PM

## 2018-11-25 NOTE — Discharge Summary (Signed)
DISCHARGE SUMMARY  Timothy Burns  MR#: [id number]  DOB:1940-06-23  Date of Admission: 11/24/2018 Date of Discharge: 11/25/2018  Attending Physician:Dacari Beckstrand Hennie Duos, MD  Patient's TDS:[id number], Audree Camel, MD  Consults: none   Disposition: D/C home   Follow-up Appts: Follow-up Information    Myrlene Broker, MD Follow up in 5 day(s).   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 He was admitted to [facility] 7/10 > 7/17 after a syncopal spell at which time he was found to be COVID positive.His syncope was ultimately felt to be related to micturition in the setting of dehydration. He was treated for COVID pneumonitis with steroids only. He was discharged on 7/17 with stable vitals, though the patient himself says he was not ready to be discharged home. After returning home the patient reports that he had very poor appetite and very little intake. He feels severely weak in general. He felt that he was developing some worsening shortness of breath. As a result the following day, 7/18, he presented again to the [facility] emergency department. The [facility] ED requested transfer to either [facility] or [facility] and ultimately a bed became available at [facility] first.  Significant Events: 7/10 diagnosed w/ COVID - admit to [facility] 7/17 D/C from [facility]  7/18 returned to [facility] ED SOB  7/20 transfered to [facility] Course:  COVID Pneumonia This remained stable during the patient's hospital stay and it appears that he  continues to recover as expected -he has not required additional oxygen above his baseline 2 to 3 L nasal cannula -he did not require additional medical therapy for his pneumonia during this admission  COPD - chronic hypoxic respiratory failure Appears relatively stable at home 2 L nasal cannula-no wheezing with good air movement in all fields at time of discharge  CKD Stage 4  Baseline crt ~2.6-creatinine remained stable with simple volume resuscitation  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 -no significant change in the patient's usual bowel habits during this admission  Dementia Appears to be relatively mild at this time  General deconditioned status - Dehydration - Anorexia  The patient is cachectic in appearance and was very weak-I suspected that he was no longer able to care for himself in a private home-with hydration however the patient improved significantly -PT/OT evaluated the patient and did not feel that he was safe for discharge home with the assistance of home health PT OT -the services were requested and at the patient's request he was cleared for discharge home   Allergies as of 11/25/2018   No Known Allergies     Medication List    STOP taking these medications   furosemide 20 MG tablet Commonly known as: LASIX     TAKE these medications   acetaminophen 325 MG tablet Commonly known as: TYLENOL Take 2 tablets (650 mg total) by mouth every 6 (six) hours as needed for mild pain or headache (fever >/= 101).   albuterol 108 (90 Base)  MCG/ACT inhaler Commonly known as: VENTOLIN HFA Inhale into the lungs as needed.   donepezil 10 MG tablet Commonly known as: ARICEPT Take by mouth at bedtime.   escitalopram 10 MG tablet Commonly known as: LEXAPRO Take by mouth daily.   ferrous sulfate 325 (65 FE) MG tablet Take by mouth daily.   fluticasone 50 MCG/ACT nasal spray Commonly known as: FLONASE Place 1  spray into both nostrils daily.   folic acid 702 MCG tablet Commonly known as: FOLVITE Take by mouth daily.   HYDROcodone-acetaminophen 5-325 MG tablet Commonly known as: NORCO/VICODIN Take 1 tablet by mouth every 6 (six) hours as needed for moderate pain.   methocarbamol 500 MG tablet Commonly known as: ROBAXIN Take 500 mg by mouth every 8 (eight) hours as needed for muscle spasms.   ondansetron 4 MG disintegrating tablet Commonly known as: ZOFRAN-ODT Take 1 tablet (4 mg total) by mouth every 6 (six) hours as needed.   OXYGEN Inhale 2 L into the lungs as needed.   Prevalite 4 GM/DOSE powder Generic drug: cholestyramine light Take 4 g by mouth as needed (diarrhea).   promethazine 25 MG tablet Commonly known as: PHENERGAN Take 1 tablet (25 mg total) by mouth every 8 (eight) hours as needed. What changed: reasons to take this   Spiriva HandiHaler 18 MCG inhalation capsule Generic drug: tiotropium Place 18 mcg into inhaler and inhale daily as needed (shortness of breath).   Teduglutide (rDNA) 5 MG Kit Commonly known as: Gattex Inject 1.2 mg sq daily   traZODone 50 MG tablet Commonly known as: DESYREL Take 50 mg by mouth at bedtime as needed for sleep.   Vitamin D (Ergocalciferol) 1.25 MG (50000 UT) Caps capsule Commonly known as: DRISDOL Take by mouth once a week.       Day of Discharge BP 129/79 (BP Location: Left Arm)   Pulse 79   Temp (!) 97.5 F (36.4 C) (Oral)   Resp 18   Ht '5\' 8"'  (1.727 m)   Wt 42.6 kg   SpO2 98%   BMI 14.29 kg/m   Physical Exam: General: No acute respiratory distress Lungs: Clear to auscultation bilaterally without wheezes or crackles Cardiovascular: Regular rate and rhythm without murmur gallop or rub normal S1 and S2 Abdomen: Nontender, nondistended, soft, bowel sounds positive, no rebound, no ascites, no appreciable mass Extremities: No significant cyanosis, clubbing, or edema bilateral lower extremities  Basic Metabolic  Panel: Recent Labs  Lab 11/25/18 0720  NA 143  K 3.2*  CL 118*  CO2 14*  GLUCOSE 82  BUN 33*  CREATININE 2.35*  CALCIUM 7.8*    Liver Function Tests: Recent Labs  Lab 11/25/18 0720  AST 19  ALT 27  ALKPHOS 71  BILITOT 0.5  PROT 7.0  ALBUMIN 2.6*    CBC: Recent Labs  Lab 11/25/18 0720  WBC 7.8  NEUTROABS 4.8  HGB 8.2*  HCT 26.1*  MCV 89.1  PLT 388    Time spent in discharge (includes decision making & examination of pt): 25 minutes  11/25/2018, 2:12 PM   Cherene Altes, MD [facility] Office  [phone]

## 2018-11-25 NOTE — Evaluation (Signed)
Physical Therapy Evaluation Patient Details Name: Timothy Burns MRN: [id number] DOB: Jun 04, 1940 Today's Date: 11/25/2018   History of Present Illness  78yo admitted to [place] 11/24/18 with a hx of COPD, HTN, Dementia, colon Ca/short gut syndrome, and CKD who presented to [facility] ED 7/18 with SOB. He was admitted to [facility] 7/10 > 7/17 after a syncopal spell at which time he was found to be COVID positive.  His syncope was ultimately felt to be related to micturition in the setting of dehydration.  He was treated for COVID pneumonitis with steroids only.  He was discharged on 7/17 with stable vitals, though the patient himself says he was not ready to be discharged home.    Clinical Impression  Pt admitted with above diagnosis. He remains weak from prolonged illness and recent hospitalization. He had no overt imbalance. He did have irregular HR with increases from 80s to 133 bpm (and then slow return to 90s). Incr HR did not occur during walking, but once seated after walking (seen twice). RN made aware. Pt currently with functional limitations due to the deficits listed below (see PT Problem List).  Pt will benefit from skilled PT to increase their independence and safety with mobility to allow discharge to the venue listed below.       Follow Up Recommendations Home health PT    Equipment Recommendations  None recommended by PT    Recommendations for Other Services       Precautions / Restrictions Restrictions Weight Bearing Restrictions: No      Mobility  Bed Mobility               General bed mobility comments: OOB in recliner on arrival  Transfers Overall transfer level: Needs assistance Equipment used: None Transfers: Sit to/from Stand Sit to Stand: Supervision         General transfer comment: supervision for safety; denied dizziness  Ambulation/Gait Ambulation/Gait assistance: Min guard;+2 safety/equipment Gait Distance (Feet): 110 Feet Assistive device:  None Gait Pattern/deviations: Step-through pattern;Decreased stride length   Gait velocity interpretation: 1.31 - 2.62 ft/sec, indicative of limited community ambulator General Gait Details: initially guarded, however improved once in hall  Stairs            Wheelchair Mobility    Modified Rankin (Stroke Patients Only)       Balance Overall balance assessment: Mild deficits observed, not formally tested(appears guarded, no overt imbalance)                                           Pertinent Vitals/Pain Pain Assessment: No/denies pain    Home Living Family/patient expects to be discharged to:: Private residence Living Arrangements: Spouse/significant other Available Help at Discharge: Family;Available 24 hours/day Type of Home: House Home Access: Stairs to enter Entrance Stairs-Rails: Right Entrance Stairs-Number of Steps: 2 Home Layout: One level Home Equipment: Walker - 2 wheels;Shower seat;Hand held shower head      Prior Function Level of Independence: Independent               Hand Dominance        Extremity/Trunk Assessment   Upper Extremity Assessment Upper Extremity Assessment: Defer to OT evaluation    Lower Extremity Assessment Lower Extremity Assessment: Generalized weakness    Cervical / Trunk Assessment Cervical / Trunk Assessment: Normal  Communication   Communication: HOH  Cognition Arousal/Alertness: Awake/alert Behavior  During Therapy: WFL for tasks assessed/performed Overall Cognitive Status: Within Functional Limits for tasks assessed                                        General Comments General comments (skin integrity, edema, etc.): Patient reports he feels generally weaker than normal, but better than when he came into hospital. Noted HR irreg and increased to 130s x 2 (not during gait however) while seated after walk in room and again when seated after walk in hall.     Exercises      Assessment/Plan    PT Assessment Patient needs continued PT services  PT Problem List Decreased strength;Decreased activity tolerance;Decreased balance;Decreased mobility;Decreased knowledge of use of DME;Cardiopulmonary status limiting activity       PT Treatment Interventions DME instruction;Gait training;Functional mobility training;Therapeutic activities;Therapeutic exercise;Balance training;Cognitive remediation;Patient/family education    PT Goals (Current goals can be found in the Care Plan section)  Acute Rehab PT Goals Patient Stated Goal: return home and regain strength; agrees to HHPT PT Goal Formulation: With patient Time For Goal Achievement: 12/09/18 Potential to Achieve Goals: Good    Frequency Min 3X/week   Barriers to discharge        Co-evaluation PT/OT/SLP Co-Evaluation/Treatment: Yes Reason for Co-Treatment: For patient/therapist safety PT goals addressed during session: Mobility/safety with mobility;Balance         AM-PAC PT "6 Clicks" Mobility  Outcome Measure Help needed turning from your back to your side while in a flat bed without using bedrails?: None Help needed moving from lying on your back to sitting on the side of a flat bed without using bedrails?: None Help needed moving to and from a bed to a chair (including a wheelchair)?: A Little Help needed standing up from a chair using your arms (e.g., wheelchair or bedside chair)?: None Help needed to walk in hospital room?: A Little Help needed climbing 3-5 steps with a railing? : A Little 6 Click Score: 21    End of Session   Activity Tolerance: Patient limited by fatigue Patient left: in chair;with call bell/phone within reach;with chair alarm set Nurse Communication: Mobility status;Other (comment)(irreg HR up to 130s) PT Visit Diagnosis: Unsteadiness on feet (R26.81);Muscle weakness (generalized) (M62.81)    Time: 4492-0100 PT Time Calculation (min) (ACUTE ONLY): 25 min   Charges:    PT Evaluation $PT Eval Low Complexity: 1 Low            [employer], PT 11/25/2018, 10:36 AM

## 2018-11-25 NOTE — TOC Transition Note (Signed)
Transition of Care [facility]) - CM/SW Discharge Note   Patient Details  Name: Timothy Burns MRN: [id number] Date of Birth: 05-Jul-1940  Transition of Care [facility]) CM/SW Contact:  Midge Minium RN, BSN, NCM-BC, ACM-RN [phone] (working remotely) Phone Number: 11/25/2018, 2:53 PM   Clinical Narrative:    CM following for dispositional needs. CM spoke to the patients daughter, Maury Dus, via phone to discuss the POC since the patient is [facility] with dementia. Patient lives at home with his spouse with nocturnal oxygen and additional DME for ambulation. PCP verified as: Dr. Unk Lightning. PT/OT eval completed with HHPT/OT recommended. CMS HH Compare discussed via phone, with [facility] selected. HH referral given to Adela Lank RN; [place]; AVS updated. Patients daughter stated she will provide transportation home. No further needs from CM.    Final next level of care: Home w Home Health Services Barriers to Discharge: No Barriers Identified   Patient Goals and CMS Choice   CMS Medicare.gov Compare Post Acute Care list provided to:: Patient Represenative (must comment)(Donna, daughter (patient with dementia and HOH)) Choice offered to / list presented to : Adult Children   Discharge Plan and Services          DME Arranged: N/A DME Agency: NA       HH Arranged: PT, OT HH Agency: [place] Date [facility] Agency Contacted: 11/25/18 Time [place]: [id number] Representative spoke with at [place]: Adela Lank RN liaison  Social Determinants of Health (SDOH) Interventions     Readmission Risk Interventions No flowsheet data found.

## 2018-11-25 NOTE — Discharge Instructions (Signed)
COVID-19 COVID-19 is a respiratory infection that is caused by a virus called severe acute respiratory syndrome coronavirus 2 (SARS-CoV-2). The disease is also known as coronavirus disease or novel coronavirus. In some people, the virus may not cause any symptoms. In others, it may cause a serious infection. The infection can get worse quickly and can lead to complications, such as:  Pneumonia, or infection of the lungs.  Acute respiratory distress syndrome or ARDS. This is fluid build-up in the lungs.  Acute respiratory failure. This is a condition in which there is not enough oxygen passing from the lungs to the body.  Sepsis or septic shock. This is a serious bodily reaction to an infection.  Blood clotting problems.  Secondary infections due to bacteria or fungus. The virus that causes COVID-19 is contagious. This means that it can spread from person to person through droplets from coughs and sneezes (respiratory secretions). What are the causes? This illness is caused by a virus. You may catch the virus by:  Breathing in droplets from an infected person's cough or sneeze.  Touching something, like a table or a doorknob, that was exposed to the virus (contaminated) and then touching your mouth, nose, or eyes. What increases the risk? Risk for infection You are more likely to be infected with this virus if you:  Live in or travel to an area with a COVID-19 outbreak.  Come in contact with a sick person who recently traveled to an area with a COVID-19 outbreak.  Provide care for or live with a person who is infected with COVID-19. Risk for serious illness You are more likely to become seriously ill from the virus if you:  Are 66 years of age or older.  Have a long-term disease that lowers your body's ability to fight infection (immunocompromised).  Live in a nursing home or long-term care facility.  Have a long-term (chronic) disease such as: ? Chronic lung disease,  including chronic obstructive pulmonary disease or asthma ? Heart disease. ? Diabetes. ? Chronic kidney disease. ? Liver disease.  Are obese. What are the signs or symptoms? Symptoms of this condition can range from mild to severe. Symptoms may appear any time from 2 to 14 days after being exposed to the virus. They include:  A fever.  A cough.  Difficulty breathing.  Chills.  Muscle pains.  A sore throat.  Loss of taste or smell. Some people may also have stomach problems, such as nausea, vomiting, or diarrhea. Other people may not have any symptoms of COVID-19. How is this diagnosed? This condition may be diagnosed based on:  Your signs and symptoms, especially if: ? You live in an area with a COVID-19 outbreak. ? You recently traveled to or from an area where the virus is common. ? You provide care for or live with a person who was diagnosed with COVID-19.  A physical exam.  Lab tests, which may include: ? A nasal swab to take a sample of fluid from your nose. ? A throat swab to take a sample of fluid from your throat. ? A sample of mucus from your lungs (sputum). ? Blood tests.  Imaging tests, which may include, X-rays, CT scan, or ultrasound. How is this treated? At present, there is no medicine to treat COVID-19. Medicines that treat other diseases are being used on a trial basis to see if they are effective against COVID-19. Your health care provider will talk with you about ways to treat your symptoms. For  most people, the infection is mild and can be managed at home with rest, fluids, and over-the-counter medicines. Treatment for a serious infection usually takes places in a hospital intensive care unit (ICU). It may include one or more of the following treatments. These treatments are given until your symptoms improve.  Receiving fluids and medicines through an IV.  Supplemental oxygen. Extra oxygen is given through a tube in the nose, a face mask, or a  hood.  Positioning you to lie on your stomach (prone position). This makes it easier for oxygen to get into the lungs.  Continuous positive airway pressure (CPAP) or bi-level positive airway pressure (BPAP) machine. This treatment uses mild air pressure to keep the airways open. A tube that is connected to a motor delivers oxygen to the body.  Ventilator. This treatment moves air into and out of the lungs by using a tube that is placed in your windpipe.  Tracheostomy. This is a procedure to create a hole in the neck so that a breathing tube can be inserted.  Extracorporeal membrane oxygenation (ECMO). This procedure gives the lungs a chance to recover by taking over the functions of the heart and lungs. It supplies oxygen to the body and removes carbon dioxide. Follow these instructions at home: Lifestyle  If you are sick, stay home except to get medical care. Your health care provider will tell you how long to stay home. Call your health care provider before you go for medical care.  Rest at home as told by your health care provider.  Do not use any products that contain nicotine or tobacco, such as cigarettes, e-cigarettes, and chewing tobacco. If you need help quitting, ask your health care provider.  Return to your normal activities as told by your health care provider. Ask your health care provider what activities are safe for you. General instructions  Take over-the-counter and prescription medicines only as told by your health care provider.  Drink enough fluid to keep your urine pale yellow.  Keep all follow-up visits as told by your health care provider. This is important. How is this prevented?  There is no vaccine to help prevent COVID-19 infection. However, there are steps you can take to protect yourself and others from this virus. To protect yourself:   Do not travel to areas where COVID-19 is a risk. The areas where COVID-19 is reported change often. To identify  high-risk areas and travel restrictions, check the CDC travel website: FatFares.com.br  If you live in, or must travel to, an area where COVID-19 is a risk, take precautions to avoid infection. ? Stay away from people who are sick. ? Wash your hands often with soap and water for 20 seconds. If soap and water are not available, use an alcohol-based hand sanitizer. ? Avoid touching your mouth, face, eyes, or nose. ? Avoid going out in public, follow guidance from your state and local health authorities. ? If you must go out in public, wear a cloth face covering or face mask. ? Disinfect objects and surfaces that are frequently touched every day. This may include:  Counters and tables.  Doorknobs and light switches.  Sinks and faucets.  Electronics, such as phones, remote controls, keyboards, computers, and tablets. To protect others: If you have symptoms of COVID-19, take steps to prevent the virus from spreading to others.  If you think you have a COVID-19 infection, contact your health care provider right away. Tell your health care team that you think you  may have a COVID-19 infection.  Stay home. Leave your house only to seek medical care. Do not use public transport.  Do not travel while you are sick.  Wash your hands often with soap and water for 20 seconds. If soap and water are not available, use alcohol-based hand sanitizer.  Stay away from other members of your household. Let healthy household members care for children and pets, if possible. If you have to care for children or pets, wash your hands often and wear a mask. If possible, stay in your own room, separate from others. Use a different bathroom.  Make sure that all people in your household wash their hands well and often.  Cough or sneeze into a tissue or your sleeve or elbow. Do not cough or sneeze into your hand or into the air.  Wear a cloth face covering or face mask. Where to find more  information  Centers for Disease Control and Prevention: PurpleGadgets.be  World Health Organization: https://www.castaneda.info/ Contact a health care provider if:  You live in or have traveled to an area where COVID-19 is a risk and you have symptoms of the infection.  You have had contact with someone who has COVID-19 and you have symptoms of the infection. Get help right away if:  You have trouble breathing.  You have pain or pressure in your chest.  You have confusion.  You have bluish lips and fingernails.  You have difficulty waking from sleep.  You have symptoms that get worse. These symptoms may represent a serious problem that is an emergency. Do not wait to see if the symptoms will go away. Get medical help right away. Call your local emergency services (911 in the [place] Do not drive yourself to the hospital. Let the emergency medical personnel know if you think you have COVID-19. Summary  COVID-19 is a respiratory infection that is caused by a virus. It is also known as coronavirus disease or novel coronavirus. It can cause serious infections, such as pneumonia, acute respiratory distress syndrome, acute respiratory failure, or sepsis.  The virus that causes COVID-19 is contagious. This means that it can spread from person to person through droplets from coughs and sneezes.  You are more likely to develop a serious illness if you are 48 years of age or older, have a weak immunity, live in a nursing home, or have chronic disease.  There is no medicine to treat COVID-19. Your health care provider will talk with you about ways to treat your symptoms.  Take steps to protect yourself and others from infection. Wash your hands often and disinfect objects and surfaces that are frequently touched every day. Stay away from people who are sick and wear a mask if you are sick. This information is not intended to replace advice given to you by  your health care provider. Make sure you discuss any questions you have with your health care provider. Document Released: 05/29/2018 Document Revised: 09/18/2018 Document Reviewed: 05/29/2018 Elsevier Patient Education  [place]. Dehydration, Adult  Dehydration is when there is not enough fluid or water in your body. This happens when you lose more fluids than you take in. Dehydration can range from mild to very bad. It should be treated right away to keep it from getting very bad. Symptoms of mild dehydration may include:  Thirst.  Dry lips.  Slightly dry mouth.  Dry, warm skin.  Dizziness. Symptoms of moderate dehydration may include:  Very dry mouth.  Muscle cramps.  Dark pee (urine). Pee may be the color of tea.  Your body making less pee.  Your eyes making fewer tears.  Heartbeat that is uneven or faster than normal (palpitations).  Headache.  Light-headedness, especially when you stand up from sitting.  Fainting (syncope). Symptoms of very bad dehydration may include:  Changes in skin, such as: ? Cold and clammy skin. ? Blotchy (mottled) or pale skin. ? Skin that does not quickly return to normal after being lightly pinched and let go (poor skin turgor).  Changes in body fluids, such as: ? Feeling very thirsty. ? Your eyes making fewer tears. ? Not sweating when body temperature is high, such as in hot weather. ? Your body making very little pee.  Changes in vital signs, such as: ? Weak pulse. ? Pulse that is more than 100 beats a minute when you are sitting still. ? Fast breathing. ? Low blood pressure.  Other changes, such as: ? Sunken eyes. ? Cold hands and feet. ? Confusion. ? Lack of energy (lethargy). ? Trouble waking up from sleep. ? Short-term weight loss. ? Unconsciousness. Follow these instructions at home:   If told by your doctor, drink an ORS: ? Make an ORS by using instructions on the package. ? Start by drinking small  amounts, about  cup (120 mL) every 5-10 minutes. ? Slowly drink more until you have had the amount that your doctor said to have.  Drink enough clear fluid to keep your pee clear or pale yellow. If you were told to drink an ORS, finish the ORS first, then start slowly drinking clear fluids. Drink fluids such as: ? Water. Do not drink only water by itself. Doing that can make the salt (sodium) level in your body get too low (hyponatremia). ? Ice chips. ? Fruit juice that you have added water to (diluted). ? Low-calorie sports drinks.  Avoid: ? Alcohol. ? Drinks that have a lot of sugar. These include high-calorie sports drinks, fruit juice that does not have water added, and soda. ? Caffeine. ? Foods that are greasy or have a lot of fat or sugar.  Take over-the-counter and prescription medicines only as told by your doctor.  Do not take salt tablets. Doing that can make the salt level in your body get too high (hypernatremia).  Eat foods that have minerals (electrolytes). Examples include bananas, oranges, potatoes, tomatoes, and spinach.  Keep all follow-up visits as told by your doctor. This is important. Contact a doctor if:  You have belly (abdominal) pain that: ? Gets worse. ? Stays in one area (localizes).  You have a rash.  You have a stiff neck.  You get angry or annoyed more easily than normal (irritability).  You are more sleepy than normal.  You have a harder time waking up than normal.  You feel: ? Weak. ? Dizzy. ? Very thirsty.  You have peed (urinated) only a small amount of very dark pee during 6-8 hours. Get help right away if:  You have symptoms of very bad dehydration.  You cannot drink fluids without throwing up (vomiting).  Your symptoms get worse with treatment.  You have a fever.  You have a very bad headache.  You are throwing up or having watery poop (diarrhea) and it: ? Gets worse. ? Does not go away.  You have blood or something  green (bile) in your throw-up.  You have blood in your poop (stool). This may cause poop to look black and tarry.  You have not peed in 6-8 hours.  You pass out (faint).  Your heart rate when you are sitting still is more than 100 beats a minute.  You have trouble breathing. This information is not intended to replace advice given to you by your health care provider. Make sure you discuss any questions you have with your health care provider. Document Released: 02/17/2009 Document Revised: 04/05/2017 Document Reviewed: 06/17/2015 Elsevier Patient Education  2020 [employer].

## 2018-12-15 DIAGNOSIS — G252 Other specified forms of tremor: Secondary | ICD-10-CM | POA: Insufficient documentation

## 2018-12-15 DIAGNOSIS — R0902 Hypoxemia: Secondary | ICD-10-CM | POA: Insufficient documentation

## 2018-12-17 DIAGNOSIS — R0689 Other abnormalities of breathing: Secondary | ICD-10-CM | POA: Insufficient documentation

## 2018-12-19 ENCOUNTER — Emergency Department (HOSPITAL_COMMUNITY): Payer: Medicare Other

## 2018-12-19 ENCOUNTER — Other Ambulatory Visit: Payer: Self-pay

## 2018-12-19 ENCOUNTER — Inpatient Hospital Stay (HOSPITAL_COMMUNITY)
Admission: EM | Admit: 2018-12-19 | Discharge: 2018-12-25 | DRG: 640 | Disposition: A | Discharge status: Home Health | Payer: Medicare Other | Attending: Family Medicine | Admitting: Family Medicine

## 2018-12-19 DIAGNOSIS — E876 Hypokalemia: Principal | ICD-10-CM | POA: Diagnosis present

## 2018-12-19 DIAGNOSIS — Z9049 Acquired absence of other specified parts of digestive tract: Secondary | ICD-10-CM

## 2018-12-19 DIAGNOSIS — Z87891 Personal history of nicotine dependence: Secondary | ICD-10-CM

## 2018-12-19 DIAGNOSIS — K912 Postsurgical malabsorption, not elsewhere classified: Secondary | ICD-10-CM | POA: Diagnosis present

## 2018-12-19 DIAGNOSIS — J9611 Chronic respiratory failure with hypoxia: Secondary | ICD-10-CM | POA: Diagnosis present

## 2018-12-19 DIAGNOSIS — K921 Melena: Secondary | ICD-10-CM | POA: Diagnosis present

## 2018-12-19 DIAGNOSIS — D631 Anemia in chronic kidney disease: Secondary | ICD-10-CM | POA: Diagnosis present

## 2018-12-19 DIAGNOSIS — I4891 Unspecified atrial fibrillation: Secondary | ICD-10-CM | POA: Diagnosis present

## 2018-12-19 DIAGNOSIS — E872 Acidosis, unspecified: Secondary | ICD-10-CM

## 2018-12-19 DIAGNOSIS — R9431 Abnormal electrocardiogram [ECG] [EKG]: Secondary | ICD-10-CM

## 2018-12-19 DIAGNOSIS — N179 Acute kidney failure, unspecified: Secondary | ICD-10-CM | POA: Diagnosis present

## 2018-12-19 DIAGNOSIS — E878 Other disorders of electrolyte and fluid balance, not elsewhere classified: Secondary | ICD-10-CM

## 2018-12-19 DIAGNOSIS — J439 Emphysema, unspecified: Secondary | ICD-10-CM | POA: Diagnosis present

## 2018-12-19 DIAGNOSIS — Z7951 Long term (current) use of inhaled steroids: Secondary | ICD-10-CM

## 2018-12-19 DIAGNOSIS — F0390 Unspecified dementia without behavioral disturbance: Secondary | ICD-10-CM | POA: Diagnosis present

## 2018-12-19 DIAGNOSIS — N184 Chronic kidney disease, stage 4 (severe): Secondary | ICD-10-CM | POA: Diagnosis present

## 2018-12-19 DIAGNOSIS — Z932 Ileostomy status: Secondary | ICD-10-CM

## 2018-12-19 DIAGNOSIS — Z9981 Dependence on supplemental oxygen: Secondary | ICD-10-CM

## 2018-12-19 DIAGNOSIS — E162 Hypoglycemia, unspecified: Secondary | ICD-10-CM | POA: Diagnosis present

## 2018-12-19 DIAGNOSIS — Z85038 Personal history of other malignant neoplasm of large intestine: Secondary | ICD-10-CM

## 2018-12-19 DIAGNOSIS — F329 Major depressive disorder, single episode, unspecified: Secondary | ICD-10-CM | POA: Diagnosis present

## 2018-12-19 DIAGNOSIS — U071 COVID-19: Secondary | ICD-10-CM | POA: Diagnosis present

## 2018-12-19 LAB — CBC
HCT: 27.7 % — ABNORMAL LOW (ref 39.0–52.0)
Hemoglobin: 8.4 g/dL — ABNORMAL LOW (ref 13.0–17.0)
MCH: 28 pg (ref 26.0–34.0)
MCHC: 30.3 g/dL (ref 30.0–36.0)
MCV: 92.3 fL (ref 80.0–100.0)
Platelets: 456 10*3/uL — ABNORMAL HIGH (ref 150–400)
RBC: 3 MIL/uL — ABNORMAL LOW (ref 4.22–5.81)
RDW: 17.6 % — ABNORMAL HIGH (ref 11.5–15.5)
WBC: 9.8 10*3/uL (ref 4.0–10.5)
nRBC: 0 % (ref 0.0–0.2)

## 2018-12-19 LAB — COMPREHENSIVE METABOLIC PANEL
ALT: 9 U/L (ref 0–44)
AST: 13 U/L — ABNORMAL LOW (ref 15–41)
Albumin: 2.5 g/dL — ABNORMAL LOW (ref 3.5–5.0)
Alkaline Phosphatase: 62 U/L (ref 38–126)
Anion gap: 15 (ref 5–15)
BUN: 23 mg/dL (ref 8–23)
CO2: 10 mmol/L — ABNORMAL LOW (ref 22–32)
Calcium: 6.2 mg/dL — CL (ref 8.9–10.3)
Chloride: 116 mmol/L — ABNORMAL HIGH (ref 98–111)
Creatinine, Ser: 3.36 mg/dL — ABNORMAL HIGH (ref 0.61–1.24)
GFR calc Af Amer: 19 mL/min — ABNORMAL LOW (ref >60)
GFR calc non Af Amer: 17 mL/min — ABNORMAL LOW (ref >60)
Glucose, Bld: 90 mg/dL (ref 70–99)
Potassium: 2.6 mmol/L — CL (ref 3.5–5.1)
Sodium: 141 mmol/L (ref 135–145)
Total Bilirubin: 0.4 mg/dL (ref 0.3–1.2)
Total Protein: 6.5 g/dL (ref 6.5–8.1)

## 2018-12-19 LAB — MAGNESIUM: Magnesium: 1.1 mg/dL — ABNORMAL LOW (ref 1.7–2.4)

## 2018-12-19 LAB — TYPE AND SCREEN
ABO/RH(D): O POS
Antibody Screen: NEGATIVE

## 2018-12-19 MED ORDER — SODIUM BICARBONATE-DEXTROSE 150-5 MEQ/L-% IV SOLN
150.0000 meq | INTRAVENOUS | Status: DC
  Administered 2018-12-20 (×2): 150 meq via INTRAVENOUS
  Filled 2018-12-19 (×7): qty 1000

## 2018-12-19 MED ORDER — MAGNESIUM SULFATE 2 GM/50ML IV SOLN
2.0000 g | Freq: Once | INTRAVENOUS | Status: AC
  Administered 2018-12-20: 2 g via INTRAVENOUS
  Filled 2018-12-19: qty 50

## 2018-12-19 MED ORDER — SODIUM BICARBONATE 8.4 % IV SOLN: INTRAVENOUS | Status: DC

## 2018-12-19 MED ORDER — POTASSIUM CHLORIDE 10 MEQ/100ML IV SOLN
10.0000 meq | Freq: Once | INTRAVENOUS | Status: AC
  Administered 2018-12-20: 10 meq via INTRAVENOUS
  Filled 2018-12-19: qty 100

## 2018-12-19 MED ORDER — POTASSIUM CHLORIDE CRYS ER 20 MEQ PO TBCR
40.0000 meq | EXTENDED_RELEASE_TABLET | Freq: Once | ORAL | Status: AC
  Administered 2018-12-20: 40 meq via ORAL
  Filled 2018-12-19: qty 2

## 2018-12-19 NOTE — ED Notes (Signed)
Pt.'s family is changing his coloscopy bag that had bursted while sitting in lobby. Daughter stated, he usually doesn't sit for no more than a hour at a time.  Pt. Placed in a recliner chair for comfort.

## 2018-12-19 NOTE — ED Provider Notes (Signed)
[facility] EMERGENCY DEPARTMENT Provider Note   CSN: [id number] Arrival date & time: 12/19/18  1208     History   Chief Complaint Chief Complaint  Patient presents with  . Abnormal Lab    HPI Timothy Burns is a 78 y.o. male.     The history is provided by the patient and medical records.  Abnormal Lab    78 year old male with history of stage IV chronic kidney disease, COPD, dementia, depression, history of colon cancer status post hemicolectomy and current ileostomy, short gut syndrome, presenting to the ED with abnormal labs.  Patient was hospitalized for COVID last month for about 2 and half weeks and it sounds like he has had a steady decline since this time.  He has been extremely weak and still intermittently short of breath, requiring supplemental O2 at night.  He has not had any ongoing cough or fever.  His appetite has been extremely poor.  Family reports he is able to get down about 1 Ensure a day, maybe a few bites of food but that is about it.  He has been extremely weak.  He has not had any vomiting or diarrhea.  They did follow-up with primary care earlier in the week and had some blood work done that was concerning with hemoglobin of 7.  They did a test on his stool and did find a small amount of blood in it, but seems to be from irritation around his stoma.  They have not seen blood mixed in his stool.  He is followed by GI, Dr. Lyndel Safe who was planning to see him soon but due to abnormal labs he was sent to ED for further evaluation and management.  Past Medical History:  Diagnosis Date  . Acute-on-chronic kidney injury ([place])   . COPD (chronic obstructive pulmonary disease) ([place])   . Dementia ([place])   . Depression   . History of colon cancer   . Hypokalemia   . Short gut syndrome   . UTI (urinary tract infection)     Patient Active Problem List   Diagnosis Date Noted  . Acute infective gastroenteritis 11/25/2018  . Generalized weakness  11/24/2018  . Chronic kidney failure, stage 3 (moderate) ([place]) 11/24/2018  . Short gut syndrome 10/02/2016    Past Surgical History:  Procedure Laterality Date  . HEMICOLECTOMY    . INGUINAL HERNIA REPAIR    . PORTACATH PLACEMENT    . Resection of Small Bowel    . SHOULDER SURGERY    . Transuretheral Section of prostate          Home Medications    Prior to Admission medications   Medication Sig Start Date End Date Taking? Authorizing Provider  acetaminophen (TYLENOL) 325 MG tablet Take 2 tablets (650 mg total) by mouth every 6 (six) hours as needed for mild pain or headache (fever >/= 101). 11/25/18   Cherene Altes, MD  albuterol (PROVENTIL HFA;VENTOLIN HFA) 108 (90 Base) MCG/ACT inhaler Inhale into the lungs as needed. 01/08/18   [provider]  cholestyramine light (PREVALITE) 4 GM/DOSE powder Take 4 g by mouth as needed (diarrhea).     [provider]  donepezil (ARICEPT) 10 MG tablet Take by mouth at bedtime.  08/07/16   [provider]  escitalopram (LEXAPRO) 10 MG tablet Take by mouth daily. 11/10/15   [provider]  ferrous sulfate 325 (65 FE) MG tablet Take by mouth daily.    [provider]  fluticasone (FLONASE) 50 MCG/ACT nasal spray Place 1 spray into both nostrils daily.  02/20/18   [provider]  folic acid (FOLVITE) 542 MCG tablet Take by mouth daily.    [provider]  HYDROcodone-acetaminophen (NORCO/VICODIN) 5-325 MG tablet Take 1 tablet by mouth every 6 (six) hours as needed for moderate pain.  12/03/14   [provider]  methocarbamol (ROBAXIN) 500 MG tablet Take 500 mg by mouth every 8 (eight) hours as needed for muscle spasms.  01/31/15   [provider]  ondansetron (ZOFRAN-ODT) 4 MG disintegrating tablet Take 1 tablet (4 mg total) by mouth every 6 (six) hours as needed. 03/07/18   Jackquline Denmark, MD  OXYGEN Inhale 2 L into the lungs as needed.     [provider]   promethazine (PHENERGAN) 25 MG tablet Take 1 tablet (25 mg total) by mouth every 8 (eight) hours as needed. Patient taking differently: Take 25 mg by mouth every 8 (eight) hours as needed for nausea or vomiting.  03/07/18   Jackquline Denmark, MD  SPIRIVA HANDIHALER 18 MCG inhalation capsule Place 18 mcg into inhaler and inhale daily as needed (shortness of breath).  03/03/18   [provider]  Teduglutide, rDNA, (GATTEX) 5 MG KIT Inject 1.2 mg sq daily 08/16/17   Jackquline Denmark, MD  traZODone (DESYREL) 50 MG tablet Take 50 mg by mouth at bedtime as needed for sleep.  06/19/17 11/25/18  [provider]  Vitamin D, Ergocalciferol, (DRISDOL) 50000 units CAPS capsule Take by mouth once a week. 08/07/17   [provider]    Family History Family History  Problem Relation Age of Onset  . Esophageal cancer Neg Hx     Social History Social History   Tobacco Use  . Smoking status: Former Smoker    Types: Cigarettes  . Smokeless tobacco: Never Used  Substance Use Topics  . Alcohol use: Not Currently  . Drug use: Not Currently     Allergies   Patient has no known allergies.   Review of Systems Review of Systems  Neurological: Positive for weakness.  All other systems reviewed and are negative.    Physical Exam Updated Vital Signs BP (!) 164/134 (BP Location: Right Arm)   Pulse (!) 101   Temp 98.2 F (36.8 C) (Oral)   Resp 18   SpO2 94%   Physical Exam Vitals signs and nursing note reviewed.  Constitutional:      Appearance: He is well-developed.     Comments: Thin, frail, chronically ill appearing  HENT:     Head: Normocephalic and atraumatic.     Mouth/Throat:     Lips: Pink.     Mouth: Mucous membranes are dry.     Comments: Dry mucous membranes Eyes:     Conjunctiva/sclera: Conjunctivae normal.     Pupils: Pupils are equal, round, and reactive to light.  Neck:     Musculoskeletal: Normal range of motion.  Cardiovascular:     Rate and Rhythm:  Regular rhythm. Tachycardia present.     Heart sounds: Normal heart sounds.     Comments: Low grady tachycardia Pulmonary:     Effort: Pulmonary effort is normal.     Breath sounds: Normal breath sounds. No stridor. No wheezing.  Abdominal:     General: Bowel sounds are normal.     Palpations: Abdomen is soft.     Comments: Ileostomy present, stoma and skin surrounding appears irritated, brown stool noted in collection bag, no visualized blood  Musculoskeletal: Normal range of motion.  Skin:    General: Skin is warm and dry.  Neurological:     Mental Status: He is alert and oriented to person, place, and time.      ED Treatments / Results  Labs (all labs ordered are listed, but only abnormal results are displayed) Labs Reviewed  SARS CORONAVIRUS 2 ([facility]) - Abnormal; Notable for the following components:      Result Value   SARS Coronavirus 2 POSITIVE (*)    All other components within normal limits  COMPREHENSIVE METABOLIC PANEL - Abnormal; Notable for the following components:   Potassium 2.6 (*)    Chloride 116 (*)    CO2 10 (*)    Creatinine, Ser 3.36 (*)    Calcium 6.2 (*)    Albumin 2.5 (*)    AST 13 (*)    GFR calc non Af Amer 17 (*)    GFR calc Af Amer 19 (*)    All other components within normal limits  CBC - Abnormal; Notable for the following components:   RBC 3.00 (*)    Hemoglobin 8.4 (*)    HCT 27.7 (*)    RDW 17.6 (*)    Platelets 456 (*)    All other components within normal limits  MAGNESIUM - Abnormal; Notable for the following components:   Magnesium 1.1 (*)    All other components within normal limits  MRSA PCR SCREENING  C DIFFICILE QUICK SCREEN W PCR REFLEX  GASTROINTESTINAL PANEL BY PCR, STOOL (REPLACES STOOL CULTURE)  MAGNESIUM  BASIC METABOLIC PANEL  PHOSPHORUS  POC OCCULT BLOOD, ED  TYPE AND SCREEN  ABO/RH    EKG None  Radiology Dg Chest Port 1 View  Result Date: 12/20/2018  CLINICAL DATA:  78 year old male with history of COVID-19 last month presenting with shortness of breath and low hemoglobin. EXAM: PORTABLE CHEST 1 VIEW COMPARISON:  Chest radiograph dated 11/25/2018 and 11/22/2018 FINDINGS: There is severe emphysema and biapical scarring. No consolidative changes. There is no pleural effusion or pneumothorax. The cardiac silhouette is within normal limits. Coronary vascular calcification and atherosclerotic calcification of the aorta. Osteopenia. No acute osseous pathology. Right pectoral Port-A-Cath with tip at the cavoatrial junction. IMPRESSION: 1. No acute cardiopulmonary process. 2. Emphysema. Electronically Signed   By: Anner Crete M.D.   On: 12/20/2018 00:16    Procedures Procedures (including critical care time)  CRITICAL CARE Performed by: Larene Pickett   Total critical care time: 45 minutes  Critical care time was exclusive of separately billable procedures and treating other patients.  Critical care was necessary to treat or prevent imminent or life-threatening deterioration.  Critical care was time spent personally by me on the following activities: development of treatment plan with patient and/or surrogate as well as nursing, discussions with consultants, evaluation of patient's response to treatment, examination of patient, obtaining history from patient or surrogate, ordering and performing treatments and interventions, ordering and review of laboratory studies, ordering and review of radiographic studies, pulse oximetry and re-evaluation of patient's condition.   Medications Ordered in ED Medications  sodium bicarbonate 150 mEq in dextrose 5% 1000 mL infusion (150 mEq Intravenous New Bag/Given 12/20/18 0121)  Chlorhexidine Gluconate Cloth 2 % PADS 6 each (has no administration in time range)  albuterol (VENTOLIN HFA) 108 (90 Base) MCG/ACT inhaler 1-2 puff (has no administration in time range)  tiotropium (SPIRIVA) inhalation capsule  ([facility] use ONLY) 18 mcg (has no  administration in time range)  fluticasone (FLONASE) 50 MCG/ACT nasal spray 1 spray (has no administration in time range)  acetaminophen (TYLENOL) tablet 650 mg (has no administration in time range)    Or  acetaminophen (TYLENOL) suppository 650 mg (has no administration in time range)  potassium chloride 10 mEq in 100 mL IVPB (has no administration in time range)  magnesium sulfate IVPB 2 g 50 mL (has no administration in time range)  calcium gluconate 2 g/ 100 mL sodium chloride IVPB (has no administration in time range)  potassium chloride 10 mEq in 100 mL IVPB (0 mEq Intravenous Stopped 12/20/18 0249)  potassium chloride SA (K-DUR) CR tablet 40 mEq (40 mEq Oral Given 12/20/18 0110)  magnesium sulfate IVPB 2 g 50 mL (0 g Intravenous Stopped 12/20/18 0249)     Initial Impression / Assessment and Plan / ED Course  I have reviewed the triage vital signs and the nursing notes.  Pertinent labs & imaging results that were available during my care of the patient were reviewed by me and considered in my medical decision making (see chart for details).  78 year old male here with abnormal labs from PCP.  They were initially sent in due to low hemoglobin of 7.  Patient is thin, frail, chronically ill-appearing.  He was hospitalized with COVID pneumonitis last month and does not sound like he ever fully recovered from this.  He has been continually weak.  Has history of stage IV CKD and has ongoing issues with this.  He is afebrile and nontoxic here but does appear overall clinically unwell.  He is on 2 L supplemental oxygen which family reports he has been using intermittently at home since his hospitalization.  He is saturating well.  His labs with stable hemoglobin of 8.4 which is grossly unchanged from last month, however renal function worsening with creatinine of 3.36 today up from his baseline of around 2.6.  His potassium and calcium are also low, bicarb of 10.  On  chart review this seems to be a ongoing issue requiring frequent correction.  In the past he has been treated with D5 bicarb drip and tolerated well.  He is also given potassium supplementation along with magnesium.    Portable chest films negative-- chronic emphysema.  COVID test remains positive.  Patient is not currently having any COVID related symptoms such as cough or fever.  He has had ongoing shortness of breath since his admission last month but this is unchanged.  Given his chronic renal issues, he may need nephrology services so will plan to admit to [facility] as opposed to [facility].  Discussed with Dr. Marlowe Sax, she has evaluated patient in the ED and will admit for ongoing care.  Final Clinical Impressions(s) / ED Diagnoses   Final diagnoses:  Acidosis, metabolic  Hypocarbia  Hypokalemia  Hypocalcemia    ED Discharge Orders    None       Larene Pickett, PA-C 12/20/18 0340    Tegeler, Gwenyth Allegra, MD 12/20/18 1100

## 2018-12-19 NOTE — ED Notes (Signed)
Dr. Gustavus Messing notified pt in [place] and K 2.6 and Calcium 6.2

## 2018-12-19 NOTE — ED Triage Notes (Signed)
Pt arrives to ED with low hemoglobin reported level of 7 and daughter states they have seen blood in his stool.

## 2018-12-20 ENCOUNTER — Encounter (HOSPITAL_COMMUNITY): Payer: Self-pay | Admitting: Internal Medicine

## 2018-12-20 ENCOUNTER — Observation Stay (HOSPITAL_COMMUNITY): Payer: Medicare Other

## 2018-12-20 DIAGNOSIS — E876 Hypokalemia: Secondary | ICD-10-CM | POA: Diagnosis not present

## 2018-12-20 DIAGNOSIS — E872 Acidosis, unspecified: Secondary | ICD-10-CM

## 2018-12-20 DIAGNOSIS — R9431 Abnormal electrocardiogram [ECG] [EKG]: Secondary | ICD-10-CM

## 2018-12-20 LAB — BASIC METABOLIC PANEL
Anion gap: 13 (ref 5–15)
BUN: 21 mg/dL (ref 8–23)
CO2: 14 mmol/L — ABNORMAL LOW (ref 22–32)
Calcium: 6.5 mg/dL — ABNORMAL LOW (ref 8.9–10.3)
Chloride: 113 mmol/L — ABNORMAL HIGH (ref 98–111)
Creatinine, Ser: 3.03 mg/dL — ABNORMAL HIGH (ref 0.61–1.24)
GFR calc Af Amer: 22 mL/min — ABNORMAL LOW (ref >60)
GFR calc non Af Amer: 19 mL/min — ABNORMAL LOW (ref >60)
Glucose, Bld: 143 mg/dL — ABNORMAL HIGH (ref 70–99)
Potassium: 3 mmol/L — ABNORMAL LOW (ref 3.5–5.1)
Sodium: 140 mmol/L (ref 135–145)

## 2018-12-20 LAB — GASTROINTESTINAL PANEL BY PCR, STOOL (REPLACES STOOL CULTURE)

## 2018-12-20 LAB — GLUCOSE, CAPILLARY
Glucose-Capillary: 121 mg/dL — ABNORMAL HIGH (ref 70–99)
Glucose-Capillary: 65 mg/dL — ABNORMAL LOW (ref 70–99)
Glucose-Capillary: 68 mg/dL — ABNORMAL LOW (ref 70–99)
Glucose-Capillary: 138 mg/dL — ABNORMAL HIGH (ref 70–99)
Glucose-Capillary: 46 mg/dL — ABNORMAL LOW (ref 70–99)
Glucose-Capillary: 51 mg/dL — ABNORMAL LOW (ref 70–99)
Glucose-Capillary: 48 mg/dL — ABNORMAL LOW (ref 70–99)
Glucose-Capillary: 56 mg/dL — ABNORMAL LOW (ref 70–99)
Glucose-Capillary: 73 mg/dL (ref 70–99)
Glucose-Capillary: 84 mg/dL (ref 70–99)
Glucose-Capillary: 103 mg/dL — ABNORMAL HIGH (ref 70–99)

## 2018-12-20 LAB — C DIFFICILE QUICK SCREEN W PCR REFLEX
C Diff antigen: NEGATIVE
C Diff interpretation: NOT DETECTED
C Diff toxin: NEGATIVE

## 2018-12-20 LAB — MRSA PCR SCREENING: MRSA by PCR: POSITIVE — AB

## 2018-12-20 LAB — ABO/RH: ABO/RH(D): O POS

## 2018-12-20 LAB — MAGNESIUM: Magnesium: 2.3 mg/dL (ref 1.7–2.4)

## 2018-12-20 LAB — POC OCCULT BLOOD, ED: Fecal Occult Bld: NEGATIVE

## 2018-12-20 LAB — SARS CORONAVIRUS 2 BY RT PCR (HOSPITAL ORDER, PERFORMED IN ~~LOC~~ HOSPITAL LAB): SARS Coronavirus 2: POSITIVE — AB

## 2018-12-20 LAB — PHOSPHORUS: Phosphorus: 5 mg/dL — ABNORMAL HIGH (ref 2.5–4.6)

## 2018-12-20 MED ORDER — CALCIUM GLUCONATE-NACL 2-0.675 GM/100ML-% IV SOLN
2.0000 g | Freq: Once | INTRAVENOUS | Status: AC
  Administered 2018-12-20: 2000 mg via INTRAVENOUS
  Filled 2018-12-20: qty 100

## 2018-12-20 MED ORDER — SODIUM BICARBONATE-DEXTROSE 150-5 MEQ/L-% IV SOLN
150.0000 meq | INTRAVENOUS | Status: DC
  Administered 2018-12-20 – 2018-12-21 (×3): 150 meq via INTRAVENOUS
  Filled 2018-12-20 (×4): qty 1000

## 2018-12-20 MED ORDER — MAGNESIUM SULFATE 2 GM/50ML IV SOLN
2.0000 g | Freq: Once | INTRAVENOUS | Status: AC
  Administered 2018-12-20: 2 g via INTRAVENOUS
  Filled 2018-12-20: qty 50

## 2018-12-20 MED ORDER — ALBUTEROL SULFATE (2.5 MG/3ML) 0.083% IN NEBU: 2.5000 mg | INHALATION_SOLUTION | Freq: Four times a day (QID) | RESPIRATORY_TRACT | Status: DC | PRN

## 2018-12-20 MED ORDER — ACETAMINOPHEN 325 MG PO TABS
650.0000 mg | ORAL_TABLET | Freq: Four times a day (QID) | ORAL | Status: DC | PRN
  Administered 2018-12-24: 650 mg via ORAL
  Filled 2018-12-20: qty 2

## 2018-12-20 MED ORDER — POTASSIUM CHLORIDE CRYS ER 20 MEQ PO TBCR
40.0000 meq | EXTENDED_RELEASE_TABLET | Freq: Once | ORAL | Status: AC
  Administered 2018-12-20: 40 meq via ORAL
  Filled 2018-12-20: qty 2

## 2018-12-20 MED ORDER — ENSURE ENLIVE PO LIQD: 237.0000 mL | Freq: Two times a day (BID) | ORAL | Status: DC

## 2018-12-20 MED ORDER — ADULT MULTIVITAMIN LIQUID CH
15.0000 mL | Freq: Every day | ORAL | Status: DC
  Administered 2018-12-20 – 2018-12-25 (×6): 15 mL via ORAL
  Filled 2018-12-20 (×6): qty 15

## 2018-12-20 MED ORDER — UMECLIDINIUM BROMIDE 62.5 MCG/INH IN AEPB
1.0000 | INHALATION_SPRAY | Freq: Every day | RESPIRATORY_TRACT | Status: DC
  Administered 2018-12-20 – 2018-12-25 (×6): 1 via RESPIRATORY_TRACT
  Filled 2018-12-20: qty 7

## 2018-12-20 MED ORDER — ACETAMINOPHEN 650 MG RE SUPP: 650.0000 mg | Freq: Four times a day (QID) | RECTAL | Status: DC | PRN

## 2018-12-20 MED ORDER — ORAL CARE MOUTH RINSE
15.0000 mL | Freq: Two times a day (BID) | OROMUCOSAL | Status: DC
  Administered 2018-12-20 – 2018-12-25 (×10): 15 mL via OROMUCOSAL

## 2018-12-20 MED ORDER — CHLORHEXIDINE GLUCONATE CLOTH 2 % EX PADS
6.0000 | MEDICATED_PAD | Freq: Every day | CUTANEOUS | Status: DC
  Administered 2018-12-20 – 2018-12-21 (×2): 6 via TOPICAL

## 2018-12-20 MED ORDER — DEXTROSE 50 % IV SOLN
INTRAVENOUS | Status: AC
  Administered 2018-12-20: 25 mL
  Filled 2018-12-20: qty 50

## 2018-12-20 MED ORDER — FLUTICASONE PROPIONATE 50 MCG/ACT NA SUSP
1.0000 | Freq: Every day | NASAL | Status: DC
  Administered 2018-12-20 – 2018-12-25 (×6): 1 via NASAL
  Filled 2018-12-20: qty 16

## 2018-12-20 MED ORDER — DEXTROSE 50 % IV SOLN
25.0000 mL | INTRAVENOUS | Status: DC | PRN
  Administered 2018-12-20 – 2018-12-21 (×2): 25 mL via INTRAVENOUS
  Filled 2018-12-20 (×2): qty 50

## 2018-12-20 MED ORDER — ALBUTEROL SULFATE HFA 108 (90 BASE) MCG/ACT IN AERS
1.0000 | INHALATION_SPRAY | Freq: Four times a day (QID) | RESPIRATORY_TRACT | Status: DC | PRN
  Administered 2018-12-24 (×2): 2 via RESPIRATORY_TRACT
  Filled 2018-12-20 (×2): qty 6.7

## 2018-12-20 MED ORDER — POTASSIUM CHLORIDE 10 MEQ/100ML IV SOLN
10.0000 meq | INTRAVENOUS | Status: AC
  Administered 2018-12-20 (×6): 10 meq via INTRAVENOUS
  Filled 2018-12-20 (×6): qty 100

## 2018-12-20 NOTE — Progress Notes (Addendum)
  PROGRESS NOTE  Patient admitted earlier this morning. See H&P. Timothy Burns is a 78 y.o. male with medical history significant of dementia, COPD, CKD stage IV, depression, history of colon cancer status post hemicolectomy and current ileostomy, short gut syndrome, admitted for COVID pneumonitis last month, on 2 L supplemental oxygen at home chronically presenting to the hospital for evaluation of low hemoglobin/abnormal labs.  Outpatient labs done by PCP showing Hgb 7.  Daughter at bedside states patient has had a lot of stool output through his ileostomy for the past 1 week.  They are changing the bag multiple times daily.  They have also noted a lot of blood in the ileostomy bag a few times this week.  Patient has not had any fevers.  He denies any abdominal pain, nausea, or vomiting.  He has chronic shortness of breath for which he uses 2 L home oxygen intermittently during the day and always at night.  No cough or chest pain.  He is followed by outpatient nephrology.   COVID positive 7/10 at [facility], discharged from [facility] to [facility] ED 7/18 with SOB, transferred to [facility] 7/10, discharged 7/21  Tested positive this admission 8/14 Remains in airborne isolation   Electrolyte derangements including severe hypokalemia, severe hypomagnesemia, and hypocalcemia -History of colon cancer status post hemicolectomy and current ileostomy, short gut syndrome.  He has been having increasing liquid stool output for the past 1 week.  Suspect electrolyte derangements are related to GI loss -Replace potassium -Mg improved   Increased stool output in the setting of history of hemicolectomy status post ileostomy, short gut syndrome -CT abdomen pelvis showed bladder distention, no evidence of bowel obstruction or diverticulitis -C. difficile PCR negative, GI pathogen panel pending  -?  Viral etiology.  Stool output diminished, only has recorded 100 cc so far  Melena   -Daughter reported large amounts of stool output over the past 1 week and episodes of bloody stool output.  Hemoglobin 8.4, no significant change since labs done 3 weeks ago.  FOBT negative.  Per EDP, looks like the blood was from irritation around his stoma.  No blood mixed in his stool. -Family wants GI consult, patient follows with Dr. Lyndel Safe   Severe non-anion gap metabolic acidosis -Suspect related to GI losses -Bicarbonate infusion  AKI on CKD stage IV -Baseline creatinine around 2.3 -Improving with IV hydration Cr 3.3 --> 3.0  -CT abdomen pelvis revealed bladder distention. Foley catheter   Hypoglycemia -Patient has been kept NPO since admission. Resume diet today and monitor blood sugars.    Dementia -At baseline can act bit confused and agitated when out of comfort zone, but overall mild dementia, per daughter.   COVID-19 positive  -Patient was admitted last month for COVID-19 viral infection. Continues to test positive. On 2 L supplemental oxygen -Continue home oxygen   Called and spoke with daughter Butch Penny > 25 min spent on phone.   Spoke with Dr. Silverio Decamp, no indication for inpatient GI consult at this time with stable Hgb and negative FOBT. Patient may follow up with Dr. Lyndel Safe.    Dessa Phi, DO [facility] [email or web address] 12/20/2018, 1:41 PM

## 2018-12-20 NOTE — Progress Notes (Signed)
Assisted tele visit to patient with daughter.  Fay Bagg M, RN  

## 2018-12-20 NOTE — ED Notes (Signed)
ED TO INPATIENT HANDOFF REPORT  ED Nurse Name and Phone #: [phone]  S Name/Age/Gender Timothy Burns 78 y.o. male Room/Bed: 017C/017C  Code Status   Code Status: Prior  Home/SNF/Other Home Patient oriented to: self, place, time and situation Is this baseline? Yes   Triage Complete: Triage complete  Chief Complaint Low Hemaglobin  Triage Note Pt arrives to ED with low hemoglobin reported level of 7 and daughter states they have seen blood in his stool.    Allergies No Known Allergies  Level of Care/Admitting Diagnosis ED Disposition    ED Disposition Condition [facility]: [place] [[id number]]  Level of Care: Telemetry Cardiac [103]  I expect the patient will be discharged within 24 hours: No (not a candidate for 5C-Observation unit)  Covid Evaluation: Confirmed COVID Positive  Diagnosis: Hypokalemia [321224]  Admitting Physician: Shela Leff [[id number]]  Attending Physician: Shela Leff [[id number]]  PT Class (Do Not Modify): Observation [104]  PT Acc Code (Do Not Modify): Observation [10022]       B Medical/Surgery History Past Medical History:  Diagnosis Date  . Acute-on-chronic kidney injury ([place])   . COPD (chronic obstructive pulmonary disease) ([place])   . Dementia ([place])   . Depression   . History of colon cancer   . Hypokalemia   . Short gut syndrome   . UTI (urinary tract infection)    Past Surgical History:  Procedure Laterality Date  . HEMICOLECTOMY    . INGUINAL HERNIA REPAIR    . PORTACATH PLACEMENT    . Resection of Small Bowel    . SHOULDER SURGERY    . Transuretheral Section of prostate       A IV Location/Drains/Wounds Patient Lines/Drains/Airways Status   Active Line/Drains/Airways    Name:   Placement date:   Placement time:   Site:   Days:   Peripheral IV 12/20/18 Left Other (Comment)   12/20/18    0115    Other (Comment)   less than 1   Peripheral IV 12/20/18 Right Other (Comment)    12/20/18    0116    Other (Comment)   less than 1   Colostomy LLQ   11/24/18    1227    LLQ   26          Intake/Output Last 24 hours No intake or output data in the 24 hours ending 12/20/18 0246  Labs/Imaging Results for orders placed or performed during the hospital encounter of 12/19/18 (from the past 48 hour(s))  Comprehensive metabolic panel     Status: Abnormal   Collection Time: 12/19/18  8:43 PM  Result Value Ref Range   Sodium 141 135 - 145 mmol/L   Potassium 2.6 (LL) 3.5 - 5.1 mmol/L    Comment: CRITICAL RESULT CALLED TO, READ BACK BY AND VERIFIED WITH: NEWNAM K,RN 12/19/18 2124 WAYK    Chloride 116 (H) 98 - 111 mmol/L   CO2 10 (L) 22 - 32 mmol/L   Glucose, Bld 90 70 - 99 mg/dL   BUN 23 8 - 23 mg/dL   Creatinine, Ser 3.36 (H) 0.61 - 1.24 mg/dL   Calcium 6.2 (LL) 8.9 - 10.3 mg/dL    Comment: CRITICAL RESULT CALLED TO, READ BACK BY AND VERIFIED WITH: NEWNAM K,RN 12/19/18 2124 WAYK    Total Protein 6.5 6.5 - 8.1 g/dL   Albumin 2.5 (L) 3.5 - 5.0 g/dL   AST 13 (L) 15 - 41 U/L   ALT  9 0 - 44 U/L   Alkaline Phosphatase 62 38 - 126 U/L   Total Bilirubin 0.4 0.3 - 1.2 mg/dL   GFR calc non Af Amer 17 (L) >60 mL/min   GFR calc Af Amer 19 (L) >60 mL/min   Anion gap 15 5 - 15    Comment: Performed at [facility] [address], [place], [place] [address]  CBC     Status: Abnormal   Collection Time: 12/19/18  8:43 PM  Result Value Ref Range   WBC 9.8 4.0 - 10.5 K/uL   RBC 3.00 (L) 4.22 - 5.81 MIL/uL   Hemoglobin 8.4 (L) 13.0 - 17.0 g/dL   HCT 27.7 (L) 39.0 - 52.0 %   MCV 92.3 80.0 - 100.0 fL   MCH 28.0 26.0 - 34.0 pg   MCHC 30.3 30.0 - 36.0 g/dL   RDW 17.6 (H) 11.5 - 15.5 %   Platelets 456 (H) 150 - 400 K/uL   nRBC 0.0 0.0 - 0.2 %    Comment: Performed at [facility] [address], [place], [place] [address]  Magnesium     Status: Abnormal   Collection Time: 12/19/18  8:43 PM  Result Value Ref Range   Magnesium 1.1 (L) 1.7 - 2.4 mg/dL    Comment:  Performed at [facility] [address] [place], [place] [address]  Type and screen [facility]     Status: None   Collection Time: 12/19/18  8:45 PM  Result Value Ref Range   ABO/RH(D) O POS    Antibody Screen NEG    Sample Expiration      12/22/2018,2359 Performed at [facility], [place] [address] [place], [place] [address]   ABO/Rh     Status: None   Collection Time: 12/19/18  8:45 PM  Result Value Ref Range   ABO/RH(D)      O POS Performed at [facility] [address] [place],  [address]   SARS Coronavirus 2 [facility] order, Performed in [facility] hospital lab) Nasopharyngeal Nasopharyngeal Swab     Status: Abnormal   Collection Time: 12/19/18 11:48 PM   Specimen: Nasopharyngeal Swab  Result Value Ref Range   SARS Coronavirus 2 POSITIVE (A) NEGATIVE    Comment: RESULT CALLED TO, READ BACK BY AND VERIFIED WITH: P Bluford Sedler RN 12/20/18 0110 JDW (NOTE) If result is NEGATIVE SARS-CoV-2 target nucleic acids are NOT DETECTED. The SARS-CoV-2 RNA is generally detectable in upper and lower  respiratory specimens during the acute phase of infection. The lowest  concentration of SARS-CoV-2 viral copies this assay can detect is 250  copies / mL. A negative result does not preclude SARS-CoV-2 infection  and should not be used as the sole basis for treatment or other  patient management decisions.  A negative result may occur with  improper specimen collection / handling, submission of specimen other  than nasopharyngeal swab, presence of viral mutation(s) within the  areas targeted by this assay, and inadequate number of viral copies  (<250 copies / mL). A negative result must be combined with clinical  observations, patient history, and epidemiological information. If result is POSITIVE SARS-CoV-2 target nucleic acids are DETECTED. The SA RS-CoV-2 RNA is generally detectable in upper and lower  respiratory specimens during the acute  phase of infection.  Positive  results are indicative of active infection with SARS-CoV-2.  Clinical  correlation with patient history and other diagnostic information  is  necessary to determine patient infection status.  Positive results do  not rule out bacterial infection or co-infection with other viruses. If result is PRESUMPTIVE POSTIVE SARS-CoV-2 nucleic acids MAY BE PRESENT.   A presumptive positive result was obtained on the submitted specimen  and confirmed on repeat testing.  While 2019 novel coronavirus  (SARS-CoV-2) nucleic acids may be present in the submitted sample  additional confirmatory testing may be necessary for epidemiological  and / or clinical management purposes  to differentiate between  SARS-CoV-2 and other Sarbecovirus currently known to infect humans.  If clinically indicated additional testing with an alternate test  methodology [phone]) is advi sed. The SARS-CoV-2 RNA is generally  detectable in upper and lower respiratory specimens during the acute  phase of infection. The expected result is Negative. Fact Sheet for Patients:  StrictlyIdeas.no Fact Sheet for Healthcare Providers: BankingDealers.co.za This test is not yet approved or cleared by the [place] FDA and has been authorized for detection and/or diagnosis of SARS-CoV-2 by FDA under an Emergency Use Authorization (EUA).  This EUA will remain in effect (meaning this test can be used) for the duration of the COVID-19 declaration under Section 564(b)(1) of the Act, 21 U.S.C. section 360bbb-3(b)(1), unless the authorization is terminated or revoked sooner. Performed at [facility], [place] [address], [place], [place] [address]    Dg Chest Port 1 View  Result Date: 12/20/2018 CLINICAL DATA:  78 year old male with history of COVID-19 last month presenting with shortness of breath and low hemoglobin. EXAM: PORTABLE CHEST 1 VIEW COMPARISON:   Chest radiograph dated 11/25/2018 and 11/22/2018 FINDINGS: There is severe emphysema and biapical scarring. No consolidative changes. There is no pleural effusion or pneumothorax. The cardiac silhouette is within normal limits. Coronary vascular calcification and atherosclerotic calcification of the aorta. Osteopenia. No acute osseous pathology. Right pectoral Port-A-Cath with tip at the cavoatrial junction. IMPRESSION: 1. No acute cardiopulmonary process. 2. Emphysema. Electronically Signed   By: Anner Crete M.D.   On: 12/20/2018 00:16    Pending Labs Unresulted Labs (From admission, onward)   None      Vitals/Pain Today's Vitals   12/19/18 1225 12/19/18 1331 12/19/18 1638 12/19/18 2218  BP:  (!) 145/72 112/66 (!) 164/134  Pulse:  72 82 (!) 101  Resp:  20 20 18   Temp:      TempSrc:      SpO2:  99% 99% 94%  PainSc: 0-No pain       Isolation Precautions Airborne and Contact precautions  Medications Medications  sodium bicarbonate 150 mEq in dextrose 5% 1000 mL infusion (150 mEq Intravenous New Bag/Given 12/20/18 0121)  potassium chloride 10 mEq in 100 mL IVPB (10 mEq Intravenous New Bag/Given 12/20/18 0118)  potassium chloride SA (K-DUR) CR tablet 40 mEq (40 mEq Oral Given 12/20/18 0110)  magnesium sulfate IVPB 2 g 50 mL (2 g Intravenous New Bag/Given 12/20/18 0127)    Mobility walks     Focused Assessments   R Recommendations: See Admitting Provider Note  Report given to:   Additional Notes:

## 2018-12-20 NOTE — Progress Notes (Signed)
Initial Nutrition Assessment  DOCUMENTATION CODES:   Underweight  INTERVENTION:  -Ensure Enlive po BID, each supplement provides 350 kcal and 20 grams of protein -Pro-stat po BID, each supplement provides 100 kcal and 20 grams of protien -Liquid MVI   NUTRITION DIAGNOSIS:   Altered GI function related to cancer and cancer related treatments(hx of colon cancer s/p hemicolectomy, ileostomy, short gut syndrome) as evidenced by other (comment)(electrolyte derangements secondary to GI loss; 65% of IBW).   GOAL:   Weight gain, Patient will meet greater than or equal to 90% of their needs   MONITOR:   PO intake, Labs, I & O's, Weight trends, Supplement acceptance  REASON FOR ASSESSMENT:   Malnutrition Screening Tool    ASSESSMENT:  RD working remotely.  78 year old male with medical history significant of dementia, COPD, CKDIV, depression, hx of colon ca s/p hemicolectomy and current ileostomy, short gut syndrome, admitted for COVID last month; on 2L O2 at home presented to ED for evaluation of low hemoglobin/abnormal labs; outpt labs performed by PCP. Daughter reports increased stool output through ileostomy for the past week, noting a lot of blood in the ileostomy bag a few times this week. Patient admitted for observation of electrolyte derangements including severe hypokalemia, hypomagnesemia, and hypocalcemia secondary to GI loss  7/10- [facility] at [facility] 7/18[facility] ED with SOB, transferred to [facility] and discharged on 7/21 8/14 - COVID positive upon admission; remains in airborne isolation  Unable to reach patient via phone today. Per chart review, MD reports no evidence of bowel obstruction or diverticulitis on CT, stool output has diminished, hemoglobin 8.4; no significant changes since labs 3 weeks ago. FOBT was negative; bloody output likely from irritation around his stoma.  Diet advanced to regular this afternoon - no recorded meals at this time; RD to  continue to monitor for po at meal times and order ONS to assist with overall po intake.   Current wt 44 kg (96.8 lb) - 65% of IBW Patient noted with 3.1 lb wt gain over the past month  I/O: +1231 ml since admit   Medications reviewed and include: sodium bicarbonate 150 mEq in D5 1000 mL @ 125 ml/hr  Labs: Potassium 3 - trending up Cr 3.03 - trending down Ca 6.5 - trending up (uncorrected) Phosphorus - 5 Hypomagnesemia - resolved  NUTRITION - FOCUSED PHYSICAL EXAM: Unable to complete at this time   Diet Order:   Diet Order            Diet regular Room service appropriate? Yes; Fluid consistency: Thin  Diet effective now              EDUCATION NEEDS:   No education needs have been identified at this time  Skin:  Skin Assessment: Reviewed RN Assessment  Last BM:  8/15 (128ml)  Height:   Ht Readings from Last 1 Encounters:  12/20/18 5\' 7"  (1.702 m)    Weight:   Wt Readings from Last 1 Encounters:  12/20/18 44 kg    Ideal Body Weight:  67.3 kg  BMI:  Body mass index is 15.19 kg/m.  Estimated Nutritional Needs:   Kcal:  6270-3500  Protein:  73-83  Fluid:  >1.4L/day  Lajuan Lines, RD, LDN [facility] Telephone [phone] After Hours/Weekend Pager: [phone]

## 2018-12-20 NOTE — Progress Notes (Signed)
Hypoglycemic Event  CBG: 65  Treatment: D50 25 mL (12.5 gm)  Symptoms: Shaky  Follow-up CBG: Time: 121 CBG Result: 0404  Possible Reasons for Event: Unknown  Comments/MD notified: standing order     Ossie Yebra, Garnette Scheuermann

## 2018-12-20 NOTE — Progress Notes (Signed)
Assisted tele visit to patient with family member.  Timothy Burns P, RN  

## 2018-12-20 NOTE — H&P (Signed)
History and Physical    Timothy Burns QXI:[id number] DOB: 10-11-1940 DOA: 12/19/2018  PCP: Timothy Broker, MD Patient coming from: Home  Chief Complaint: Low hemoglobin/ abnormal labs  HPI: Timothy Burns is a 78 y.o. male with medical history significant of dementia, COPD, CKD stage IV, depression, history of colon cancer status post hemicolectomy and current ileostomy, short gut syndrome, admitted for COVID pneumonitis last month, on 2 L supplemental oxygen at home chronically presenting to the hospital for evaluation of low hemoglobin/abnormal labs.  Outpatient labs done by PCP showing Hgb 7.  Daughter at bedside states patient has had a lot of stool output through his ileostomy for the past 1 week.  They are changing the bag multiple times daily.  They have also noted a lot of blood in the ileostomy bag a few times this week.  Patient has not had any fevers.  He denies any abdominal pain, nausea, or vomiting.  He has chronic shortness of breath for which he uses 2 L home oxygen intermittently during the day and always at night.  No cough or chest pain.  He is followed by outpatient nephrology.  ED Course: Vitals stable on arrival.  No leukocytosis.  Hemoglobin 8.4, no significant change since labs done 3 weeks ago.  Platelet count 456,000.  Potassium 2.6.  Magnesium 1.1.  Bicarb 10, was 14 on 7/21.  Anion gap 15.  Corrected calcium 7.4.  BUN 23, creatinine 3.3.  Creatinine was 2.3 three weeks ago.  LFTs normal.  FOBT negative.  COVID-19 rapid test positive.  Chest x-ray showing emphysema and no acute cardiopulmonary process.  Found to be in A. fib on arrival to the ED, rate controlled.  Subsequently converted to sinus rhythm.  Patient received potassium supplementation, IV magnesium 2 g, and was started on bicarb infusion.  Review of Systems:  All systems reviewed and apart from history of presenting illness, are negative.  Past Medical History:  Diagnosis Date  . Acute-on-chronic kidney  injury ([place])   . COPD (chronic obstructive pulmonary disease) ([place])   . Dementia ([place])   . Depression   . History of colon cancer   . Hypokalemia   . Short gut syndrome   . UTI (urinary tract infection)     Past Surgical History:  Procedure Laterality Date  . HEMICOLECTOMY    . INGUINAL HERNIA REPAIR    . PORTACATH PLACEMENT    . Resection of Small Bowel    . SHOULDER SURGERY    . Transuretheral Section of prostate       reports that he has quit smoking. His smoking use included cigarettes. He has never used smokeless tobacco. He reports previous alcohol use. He reports previous drug use.  No Known Allergies  Family History  Problem Relation Age of Onset  . Transient ischemic attack Mother   . Esophageal cancer Neg Hx     Prior to Admission medications   Medication Sig Start Date End Date Taking? Authorizing Provider  acetaminophen (TYLENOL) 325 MG tablet Take 2 tablets (650 mg total) by mouth every 6 (six) hours as needed for mild pain or headache (fever >/= 101). 11/25/18  Yes Timothy Altes, MD  albuterol (PROVENTIL HFA;VENTOLIN HFA) 108 (90 Base) MCG/ACT inhaler Inhale 1-2 puffs into the lungs every 6 (six) hours as needed for wheezing or shortness of breath.  01/08/18  Yes [provider]  cholestyramine light (PREVALITE) 4 GM/DOSE powder Take 4 g by mouth as needed (diarrhea).  Yes [provider]  donepezil (ARICEPT) 10 MG tablet Take 10 mg by mouth at bedtime.  08/07/16  Yes [provider]  escitalopram (LEXAPRO) 10 MG tablet Take 10 mg by mouth daily.  11/10/15  Yes [provider]  ferrous sulfate 325 (65 FE) MG tablet Take 325 mg by mouth daily.    Yes [provider]  fluticasone (FLONASE) 50 MCG/ACT nasal spray Place 1 spray into both nostrils daily.  02/20/18  Yes [provider]  folic acid (FOLVITE) 809 MCG tablet Take 800 mcg by mouth daily.    Yes [provider]  HYDROcodone-acetaminophen  (NORCO/VICODIN) 5-325 MG tablet Take 1 tablet by mouth every 6 (six) hours as needed for moderate pain.  12/03/14  Yes [provider]  loperamide (IMODIUM) 2 MG capsule Take 4 mg by mouth daily as needed for diarrhea or loose stools.   Yes [provider]  methocarbamol (ROBAXIN) 500 MG tablet Take 500 mg by mouth every 8 (eight) hours as needed for muscle spasms.  01/31/15  Yes [provider]  ondansetron (ZOFRAN-ODT) 4 MG disintegrating tablet Take 1 tablet (4 mg total) by mouth every 6 (six) hours as needed. Patient taking differently: Take 4 mg by mouth every 6 (six) hours as needed for nausea or vomiting.  03/07/18  Yes Timothy Denmark, MD  OXYGEN Inhale 2 L into the lungs daily as needed (O2 LEVELS).    Yes [provider]  promethazine (PHENERGAN) 25 MG tablet Take 1 tablet (25 mg total) by mouth every 8 (eight) hours as needed. Patient taking differently: Take 25 mg by mouth every 8 (eight) hours as needed for nausea or vomiting.  03/07/18  Yes Timothy Denmark, MD  SPIRIVA HANDIHALER 18 MCG inhalation capsule Place 18 mcg into inhaler and inhale daily as needed (shortness of breath).  03/03/18  Yes [provider]  Teduglutide, rDNA, (GATTEX) 5 MG KIT Inject 1.2 mg sq daily Patient taking differently: Inject 0.12 mLs into the skin daily. Inject 1.2 mg sq daily 08/16/17  Yes Timothy Denmark, MD  traZODone (DESYREL) 50 MG tablet Take 50 mg by mouth at bedtime as needed for sleep.  06/19/17 12/20/18 Yes [provider]  Vitamin D, Ergocalciferol, (DRISDOL) 50000 units CAPS capsule Take 50,000 Units by mouth once a week.  08/07/17  Yes [provider]    Physical Exam: Vitals:   12/19/18 2218 12/20/18 0344 12/20/18 0356 12/20/18 0400  BP: (!) 164/134 (!) 143/57  (!) 143/57  Pulse: (!) 101  66 69  Resp: 18 (!) 32 17 20  Temp:   97.9 F (36.6 C)   TempSrc:   Oral   SpO2: 94%  100% 100%  Weight:   44 kg   Height:   '5\' 7"'  (1.702 m)      Physical Exam  Constitutional: He is oriented to person, place, and time. He appears well-developed and well-nourished. No distress.  HENT:  Head: Normocephalic.  Eyes: Right eye exhibits no discharge. Left eye exhibits no discharge.  Neck: Neck supple.  Cardiovascular: Normal rate, regular rhythm and intact distal pulses.  Pulmonary/Chest: Effort normal. No respiratory distress. He has no wheezes. He has no rales.  Abdominal: Soft. Bowel sounds are normal. He exhibits no distension. There is no abdominal tenderness. There is no rebound and no guarding.  Ileostomy bag with liquid brown stool  Musculoskeletal:        General: No edema.  Neurological: He is alert and oriented to person,  place, and time.  Skin: Skin is warm and dry. He is not diaphoretic.     Labs on Admission: I have personally reviewed following labs and imaging studies  CBC: Recent Labs  Lab 12/19/18 2043  WBC 9.8  HGB 8.4*  HCT 27.7*  MCV 92.3  PLT 086*   Basic Metabolic Panel: Recent Labs  Lab 12/19/18 2043  NA 141  K 2.6*  CL 116*  CO2 10*  GLUCOSE 90  BUN 23  CREATININE 3.36*  CALCIUM 6.2*  MG 1.1*   GFR: Estimated Creatinine Clearance: 11.3 mL/min (A) (by C-G formula based on SCr of 3.36 mg/dL (H)). Liver Function Tests: Recent Labs  Lab 12/19/18 2043  AST 13*  ALT 9  ALKPHOS 62  BILITOT 0.4  PROT 6.5  ALBUMIN 2.5*   No results for input(s): LIPASE, AMYLASE in the last 168 hours. No results for input(s): AMMONIA in the last 168 hours. Coagulation Profile: No results for input(s): INR, PROTIME in the last 168 hours. Cardiac Enzymes: No results for input(s): CKTOTAL, CKMB, CKMBINDEX, TROPONINI in the last 168 hours. BNP (last 3 results) No results for input(s): PROBNP in the last 8760 hours. HbA1C: No results for input(s): HGBA1C in the last 72 hours. CBG: Recent Labs  Lab 12/20/18 0348 12/20/18 0404  GLUCAP 65* 121*   Lipid Profile: No results for input(s): CHOL, HDL,  LDLCALC, TRIG, CHOLHDL, LDLDIRECT in the last 72 hours. Thyroid Function Tests: No results for input(s): TSH, T4TOTAL, FREET4, T3FREE, THYROIDAB in the last 72 hours. Anemia Panel: No results for input(s): VITAMINB12, FOLATE, FERRITIN, TIBC, IRON, RETICCTPCT in the last 72 hours. Urine analysis: No results found for: COLORURINE, APPEARANCEUR, LABSPEC, PHURINE, GLUCOSEU, HGBUR, BILIRUBINUR, KETONESUR, PROTEINUR, UROBILINOGEN, NITRITE, LEUKOCYTESUR  Radiological Exams on Admission: Dg Chest Port 1 View  Result Date: 12/20/2018 CLINICAL DATA:  78 year old male with history of COVID-19 last month presenting with shortness of breath and low hemoglobin. EXAM: PORTABLE CHEST 1 VIEW COMPARISON:  Chest radiograph dated 11/25/2018 and 11/22/2018 FINDINGS: There is severe emphysema and biapical scarring. No consolidative changes. There is no pleural effusion or pneumothorax. The cardiac silhouette is within normal limits. Coronary vascular calcification and atherosclerotic calcification of the aorta. Osteopenia. No acute osseous pathology. Right pectoral Port-A-Cath with tip at the cavoatrial junction. IMPRESSION: 1. No acute cardiopulmonary process. 2. Emphysema. Electronically Signed   By: Anner Crete M.D.   On: 12/20/2018 00:16    EKG: Independently reviewed.  Atrial fibrillation, heart rate 86.  QTc 660.  Assessment/Plan Principal Problem:   Hypokalemia Active Problems:   Hypomagnesemia   Hypocalcemia   QT prolongation   Metabolic acidosis   Electrolyte derangements including severe hypokalemia, severe hypomagnesemia, and hypocalcemia. QT prolongation on EKG. -History of colon cancer status post hemicolectomy and current ileostomy, short gut syndrome.  He has been having increasing liquid stool output for the past 1 week.  Suspect electrolyte derangements are related to GI loss. -Cardiac monitoring.  Repeat EKG in a.m.  Avoid QT prolonging drugs if possible. -Replete potassium, magnesium,  calcium and monitor closely -Keep potassium greater than 4 and magnesium greater than 2  Increased stool output in the setting of history of hemicolectomy status post ileostomy, short gut syndrome -Daughter reported large amounts of stool output over the past 1 week and episodes of bloody stool output.  Hemoglobin 8.4, no significant change since labs done 3 weeks ago.  FOBT negative.  Currently has brown liquid stool in ileostomy bag.   -LFTs normal -Afebrile and  no leukocytosis. Will hold off starting antibiotics at this time.  -CT abdomen pelvis pending -C. difficile PCR, GI pathogen panel -Consult GI in a.m. Patient is seen by Dr. Lyndel Safe on an outpatient basis. -Ileostomy care  Severe non-anion gap metabolic acidosis -Suspect related to GI losses and acute worsening of underlying advanced chronic kidney disease.  Bicarb was low 3 weeks ago as well, now worse. -Bicarbonate infusion -Continue to monitor bicarb level  AKI on CKD stage IV BUN 23, creatinine 3.3.  Creatinine was 2.3 three weeks ago.  Suspect prerenal due to dehydration. -IV fluid hydration -Continue to monitor renal function -Monitor urine output -CT abdomen pelvis pending  COVID-19 positive  -Patient was admitted last month for COVID-19 viral infection.  Continues to test positive.  On 2 L supplemental oxygen (same as home), no change.  Chest x-ray without infiltrates. -Continue home oxygen -Airborne and contact precautions  DVT prophylaxis: SCDs at this time Code Status: Patient wishes to be full code. Family Communication: Daughters at bedside. Disposition Plan: Anticipate discharge after clinical improvement. Consults called: None Admission status: It is my clinical opinion that referral for OBSERVATION is reasonable and necessary in this patient based on the above information provided. The aforementioned taken together are felt to place the patient at high risk for further clinical deterioration. However it is  anticipated that the patient may be medically stable for discharge from the hospital within 24 to 48 hours.  The medical decision making on this patient was of high complexity and the patient is at high risk for clinical deterioration, therefore this is a level 3 visit.  Shela Leff MD [facility] Pager [phone]  If 7PM-7AM, please contact night-coverage [email or web address] Password [facility]  12/20/2018, 7:32 AM

## 2018-12-21 DIAGNOSIS — J439 Emphysema, unspecified: Secondary | ICD-10-CM | POA: Diagnosis present

## 2018-12-21 DIAGNOSIS — N179 Acute kidney failure, unspecified: Secondary | ICD-10-CM | POA: Diagnosis present

## 2018-12-21 DIAGNOSIS — Z9049 Acquired absence of other specified parts of digestive tract: Secondary | ICD-10-CM | POA: Diagnosis not present

## 2018-12-21 DIAGNOSIS — D631 Anemia in chronic kidney disease: Secondary | ICD-10-CM | POA: Diagnosis present

## 2018-12-21 DIAGNOSIS — E876 Hypokalemia: Secondary | ICD-10-CM | POA: Diagnosis present

## 2018-12-21 DIAGNOSIS — E872 Acidosis: Secondary | ICD-10-CM | POA: Diagnosis present

## 2018-12-21 DIAGNOSIS — Z932 Ileostomy status: Secondary | ICD-10-CM | POA: Diagnosis not present

## 2018-12-21 DIAGNOSIS — U071 COVID-19: Secondary | ICD-10-CM | POA: Diagnosis present

## 2018-12-21 DIAGNOSIS — Z7951 Long term (current) use of inhaled steroids: Secondary | ICD-10-CM | POA: Diagnosis not present

## 2018-12-21 DIAGNOSIS — N184 Chronic kidney disease, stage 4 (severe): Secondary | ICD-10-CM | POA: Diagnosis present

## 2018-12-21 DIAGNOSIS — Z9981 Dependence on supplemental oxygen: Secondary | ICD-10-CM | POA: Diagnosis not present

## 2018-12-21 DIAGNOSIS — Z85038 Personal history of other malignant neoplasm of large intestine: Secondary | ICD-10-CM | POA: Diagnosis not present

## 2018-12-21 DIAGNOSIS — I4891 Unspecified atrial fibrillation: Secondary | ICD-10-CM | POA: Diagnosis present

## 2018-12-21 DIAGNOSIS — K921 Melena: Secondary | ICD-10-CM | POA: Diagnosis present

## 2018-12-21 DIAGNOSIS — J9611 Chronic respiratory failure with hypoxia: Secondary | ICD-10-CM | POA: Diagnosis present

## 2018-12-21 DIAGNOSIS — Z87891 Personal history of nicotine dependence: Secondary | ICD-10-CM | POA: Diagnosis not present

## 2018-12-21 DIAGNOSIS — K912 Postsurgical malabsorption, not elsewhere classified: Secondary | ICD-10-CM | POA: Diagnosis present

## 2018-12-21 DIAGNOSIS — E162 Hypoglycemia, unspecified: Secondary | ICD-10-CM | POA: Diagnosis present

## 2018-12-21 DIAGNOSIS — F0390 Unspecified dementia without behavioral disturbance: Secondary | ICD-10-CM | POA: Diagnosis present

## 2018-12-21 DIAGNOSIS — F329 Major depressive disorder, single episode, unspecified: Secondary | ICD-10-CM | POA: Diagnosis present

## 2018-12-21 LAB — BASIC METABOLIC PANEL
Anion gap: 13 (ref 5–15)
BUN: 17 mg/dL (ref 8–23)
CO2: 25 mmol/L (ref 22–32)
Calcium: 6.1 mg/dL — CL (ref 8.9–10.3)
Chloride: 100 mmol/L (ref 98–111)
Creatinine, Ser: 2.58 mg/dL — ABNORMAL HIGH (ref 0.61–1.24)
GFR calc Af Amer: 26 mL/min — ABNORMAL LOW (ref >60)
GFR calc non Af Amer: 23 mL/min — ABNORMAL LOW (ref >60)
Glucose, Bld: 96 mg/dL (ref 70–99)
Potassium: 2.6 mmol/L — CL (ref 3.5–5.1)
Sodium: 138 mmol/L (ref 135–145)

## 2018-12-21 LAB — POTASSIUM: Potassium: 3 mmol/L — ABNORMAL LOW (ref 3.5–5.1)

## 2018-12-21 LAB — MAGNESIUM: Magnesium: 1.8 mg/dL (ref 1.7–2.4)

## 2018-12-21 LAB — GLUCOSE, CAPILLARY
Glucose-Capillary: 131 mg/dL — ABNORMAL HIGH (ref 70–99)
Glucose-Capillary: 46 mg/dL — ABNORMAL LOW (ref 70–99)
Glucose-Capillary: 96 mg/dL (ref 70–99)

## 2018-12-21 LAB — CBC
HCT: 24.1 % — ABNORMAL LOW (ref 39.0–52.0)
Hemoglobin: 7.9 g/dL — ABNORMAL LOW (ref 13.0–17.0)
MCH: 28.4 pg (ref 26.0–34.0)
MCHC: 32.8 g/dL (ref 30.0–36.0)
MCV: 86.7 fL (ref 80.0–100.0)
Platelets: 402 10*3/uL — ABNORMAL HIGH (ref 150–400)
RBC: 2.78 MIL/uL — ABNORMAL LOW (ref 4.22–5.81)
RDW: 17.2 % — ABNORMAL HIGH (ref 11.5–15.5)
WBC: 8.7 10*3/uL (ref 4.0–10.5)
nRBC: 0 % (ref 0.0–0.2)

## 2018-12-21 MED ORDER — BOOST PLUS PO LIQD
237.0000 mL | Freq: Three times a day (TID) | ORAL | Status: DC
  Administered 2018-12-21 – 2018-12-25 (×12): 237 mL via ORAL
  Filled 2018-12-21 (×13): qty 237

## 2018-12-21 MED ORDER — POTASSIUM CHLORIDE CRYS ER 20 MEQ PO TBCR
40.0000 meq | EXTENDED_RELEASE_TABLET | Freq: Once | ORAL | Status: AC
  Administered 2018-12-21: 40 meq via ORAL
  Filled 2018-12-21: qty 2

## 2018-12-21 MED ORDER — LOPERAMIDE HCL 2 MG PO CAPS
4.0000 mg | ORAL_CAPSULE | Freq: Every day | ORAL | Status: DC | PRN
  Administered 2018-12-21 – 2018-12-24 (×3): 4 mg via ORAL
  Filled 2018-12-21 (×3): qty 2

## 2018-12-21 MED ORDER — KCL IN DEXTROSE-NACL 40-5-0.9 MEQ/L-%-% IV SOLN
INTRAVENOUS | Status: DC
  Administered 2018-12-21 – 2018-12-22 (×3): via INTRAVENOUS
  Filled 2018-12-21 (×3): qty 1000

## 2018-12-21 MED ORDER — SODIUM CHLORIDE 0.9 % IV SOLN
INTRAVENOUS | Status: DC | PRN
  Administered 2018-12-21: 06:00:00 via INTRAVENOUS

## 2018-12-21 MED ORDER — MAGNESIUM SULFATE 2 GM/50ML IV SOLN
2.0000 g | Freq: Once | INTRAVENOUS | Status: AC
  Administered 2018-12-21: 2 g via INTRAVENOUS
  Filled 2018-12-21: qty 50

## 2018-12-21 MED ORDER — CALCIUM GLUCONATE-NACL 2-0.675 GM/100ML-% IV SOLN
2.0000 g | Freq: Once | INTRAVENOUS | Status: AC
  Administered 2018-12-21: 2000 mg via INTRAVENOUS
  Filled 2018-12-21 (×2): qty 100

## 2018-12-21 MED ORDER — TEDUGLUTIDE (RDNA) 5 MG ~~LOC~~ KIT
1.2000 mg | PACK | Freq: Every day | SUBCUTANEOUS | Status: DC
  Administered 2018-12-21 – 2018-12-25 (×5): 1.2 mg via SUBCUTANEOUS
  Filled 2018-12-21: qty 1

## 2018-12-21 MED ORDER — DEXTROSE 50 % IV SOLN
INTRAVENOUS | Status: AC
  Filled 2018-12-21: qty 50

## 2018-12-21 MED ORDER — POTASSIUM CHLORIDE 10 MEQ/100ML IV SOLN
10.0000 meq | INTRAVENOUS | Status: DC
  Administered 2018-12-21 (×2): 10 meq via INTRAVENOUS
  Filled 2018-12-21 (×2): qty 100

## 2018-12-21 MED ORDER — SODIUM CHLORIDE 0.9 % IV SOLN: INTRAVENOUS | Status: DC

## 2018-12-21 NOTE — Progress Notes (Signed)
PROGRESS NOTE    Timothy Burns  PXT:[id number] DOB: 10-18-1940 DOA: 12/19/2018 PCP: Myrlene Broker, MD     Brief Narrative:  Timothy Burns a 78 y.o.malewith medical history significant ofdementia, COPD, CKD stage IV, depression, history of colon cancer status post hemicolectomy and current ileostomy, short gut syndrome, admitted for COVID pneumonitis last month, on 2 L supplemental oxygenat home chronicallypresenting to the hospital for evaluation of low hemoglobin/abnormal labs. Outpatient labs done by PCP showing Hgb 7.Daughter at bedside states patient has had a lot of stool output through his ileostomy for the past 1 week. They are changing the bag multiple times daily. They have also noted a lot of blood in the ileostomy bag a few times this week. Patient has not had any fevers. He denies any abdominal pain, nausea, or vomiting. He has chronic shortness of breath for which he uses 2 L home oxygen intermittently during the day and always at night. No cough or chest pain. He is followed by outpatient nephrology.   COVID positive 7/10 at [facility], discharged from [facility] to [place] [facility] 7/18 with SOB, transferred to [facility] 7/10, discharged 7/21  Tested positive this admission 8/14 Remains in airborne isolation this admission   New events last 24 hours / Subjective: Patient without any complaints today.  No complaints of worsening shortness of breath, abdominal pain, nausea or vomiting.  Assessment & Plan:   Principal Problem:   Hypokalemia Active Problems:   Hypomagnesemia   Hypocalcemia   QT prolongation   Metabolic acidosis   Electrolyte derangements including severe hypokalemia, severe hypomagnesemia,andhypocalcemia -History of colon cancer status post hemicolectomy and current ileostomy, short gut syndrome.He has been having increasing liquid stool output for the past 1 week. Suspect electrolyte derangements arerelated to  GI loss -Replace potassium today, repeat lab work this afternoon -Mg improved   Increased stool output in the setting of history of hemicolectomy status post ileostomy, short gut syndrome -CT abdomen pelvis showed bladder distention, no evidence of bowel obstruction or diverticulitis -C. difficile PCR negative, GI pathogen panel  negative -?  Viral etiology.  Stool output has now slowed down -Ordered home Gattex   Melena?  -Daughter reported large amounts of stool output over the past 1 week and episodes of bloody stool output. Hemoglobin 8.4, no significant change since labs done 3 weeks ago. FOBT negative. Per EDP, looks like the blood was from irritation around his stoma.  No blood mixed in his stool. -Family wants GI consult, patient follows with Dr. Lyndel Safe  -Spoke with Dr. Silverio Decamp, no indication for inpatient GI consult at this time with stable Hgb and negative FOBT. Patient may follow up with Dr. Lyndel Safe.   Severe non-anion gap metabolic acidosis -Suspect related to GI losses -Resolved  AKI on CKD stage IV -Baseline creatinine around 2.3 -Improving with IV hydration Cr 3.3 --> 3.0 --> 2.58 -CT abdomen pelvis revealed bladder distention. Foley catheter  placed  Dementia -At baseline can act bit confused and agitated when out of comfort zone, but overall mild dementia, per daughter.  -Stable today  COVID-19 positive -Patient was admitted last month for COVID-19 viral infection. Continues to test positive. On 2 L supplemental oxygen -Continue home oxygen    DVT prophylaxis: SCD Code Status: Full Family Communication: Spoke with daughter over the phone Disposition Plan: Pending improvement, ostomy RN eval   Consultants:   GI over the phone only  Procedures:   None   Antimicrobials:  Anti-infectives (From admission,  onward)   None        Objective: Vitals:   12/21/18 0700 12/21/18 0745 12/21/18 0800 12/21/18 1000  BP:   (!) 133/57 (!) 120/49   Pulse: 64  83 (!) 59  Resp: 19  17 18   Temp:  98.3 F (36.8 C)    TempSrc:  Oral    SpO2: 100%  100% 97%  Weight:      Height:        Intake/Output Summary (Last 24 hours) at 12/21/2018 1043 Last data filed at 12/21/2018 0900 Gross per 24 hour  Intake 2675.67 ml  Output 2675 ml  Net 0.67 ml   Filed Weights   12/20/18 0356  Weight: 44 kg    Examination:  General exam: Appears calm and comfortable  Respiratory system: Respiratory effort normal. No respiratory distress. No conversational dyspnea.  Cardiovascular system: S1 & S2 heard, RRR. No murmurs. No pedal edema. Gastrointestinal system: Abdomen is nondistended, soft and nontender. + Ileostomy with liquid green stool without any blood Central nervous system: Alert and oriented. No focal neurological deficits. Speech clear.  Extremities: Symmetric in appearance  Skin: No rashes, lesions or ulcers on exposed skin  Psychiatry: Judgement and insight appear normal. Mood & affect appropriate.   Data Reviewed: I have personally reviewed following labs and imaging studies  CBC: Recent Labs  Lab 12/19/18 2043 12/21/18 0456  WBC 9.8 8.7  HGB 8.4* 7.9*  HCT 27.7* 24.1*  MCV 92.3 86.7  PLT 456* 025*   Basic Metabolic Panel: Recent Labs  Lab 12/19/18 2043 12/20/18 0950 12/21/18 0456  NA 141 140 138  K 2.6* 3.0* 2.6*  CL 116* 113* 100  CO2 10* 14* 25  GLUCOSE 90 143* 96  BUN 23 21 17   CREATININE 3.36* 3.03* 2.58*  CALCIUM 6.2* 6.5* 6.1*  MG 1.1* 2.3 1.8  PHOS  --  5.0*  --    GFR: Estimated Creatinine Clearance: 14.7 mL/min (A) (by C-G formula based on SCr of 2.58 mg/dL (H)). Liver Function Tests: Recent Labs  Lab 12/19/18 2043  AST 13*  ALT 9  ALKPHOS 62  BILITOT 0.4  PROT 6.5  ALBUMIN 2.5*   No results for input(s): LIPASE, AMYLASE in the last 168 hours. No results for input(s): AMMONIA in the last 168 hours. Coagulation Profile: No results for input(s): INR, PROTIME in the last 168 hours. Cardiac  Enzymes: No results for input(s): CKTOTAL, CKMB, CKMBINDEX, TROPONINI in the last 168 hours. BNP (last 3 results) No results for input(s): PROBNP in the last 8760 hours. HbA1C: No results for input(s): HGBA1C in the last 72 hours. CBG: Recent Labs  Lab 12/20/18 1247 12/20/18 1824 12/20/18 2319 12/21/18 0742 12/21/18 0756  GLUCAP 73 84 103* 46* 131*   Lipid Profile: No results for input(s): CHOL, HDL, LDLCALC, TRIG, CHOLHDL, LDLDIRECT in the last 72 hours. Thyroid Function Tests: No results for input(s): TSH, T4TOTAL, FREET4, T3FREE, THYROIDAB in the last 72 hours. Anemia Panel: No results for input(s): VITAMINB12, FOLATE, FERRITIN, TIBC, IRON, RETICCTPCT in the last 72 hours. Sepsis Labs: No results for input(s): PROCALCITON, LATICACIDVEN in the last 168 hours.  Recent Results (from the past 240 hour(s))  SARS Coronavirus 2 [facility] order, Performed in [facility] hospital lab) Nasopharyngeal Nasopharyngeal Swab     Status: Abnormal   Collection Time: 12/19/18 11:48 PM   Specimen: Nasopharyngeal Swab  Result Value Ref Range Status   SARS Coronavirus 2 POSITIVE (A) NEGATIVE Final    Comment: RESULT CALLED TO,  READ BACK BY AND VERIFIED WITH: P JOHNSTON RN 12/20/18 0110 JDW (NOTE) If result is NEGATIVE SARS-CoV-2 target nucleic acids are NOT DETECTED. The SARS-CoV-2 RNA is generally detectable in upper and lower  respiratory specimens during the acute phase of infection. The lowest  concentration of SARS-CoV-2 viral copies this assay can detect is 250  copies / mL. A negative result does not preclude SARS-CoV-2 infection  and should not be used as the sole basis for treatment or other  patient management decisions.  A negative result may occur with  improper specimen collection / handling, submission of specimen other  than nasopharyngeal swab, presence of viral mutation(s) within the  areas targeted by this assay, and inadequate number of viral copies  (<250 copies / mL). A  negative result must be combined with clinical  observations, patient history, and epidemiological information. If result is POSITIVE SARS-CoV-2 target nucleic acids are DETECTED. The SA RS-CoV-2 RNA is generally detectable in upper and lower  respiratory specimens during the acute phase of infection.  Positive  results are indicative of active infection with SARS-CoV-2.  Clinical  correlation with patient history and other diagnostic information is  necessary to determine patient infection status.  Positive results do  not rule out bacterial infection or co-infection with other viruses. If result is PRESUMPTIVE POSTIVE SARS-CoV-2 nucleic acids MAY BE PRESENT.   A presumptive positive result was obtained on the submitted specimen  and confirmed on repeat testing.  While 2019 novel coronavirus  (SARS-CoV-2) nucleic acids may be present in the submitted sample  additional confirmatory testing may be necessary for epidemiological  and / or clinical management purposes  to differentiate between  SARS-CoV-2 and other Sarbecovirus currently known to infect humans.  If clinically indicated additional testing with an alternate test  methodology [phone]) is advi sed. The SARS-CoV-2 RNA is generally  detectable in upper and lower respiratory specimens during the acute  phase of infection. The expected result is Negative. Fact Sheet for Patients:  StrictlyIdeas.no Fact Sheet for Healthcare Providers: BankingDealers.co.za This test is not yet approved or cleared by the [place] FDA and has been authorized for detection and/or diagnosis of SARS-CoV-2 by FDA under an Emergency Use Authorization (EUA).  This EUA will remain in effect (meaning this test can be used) for the duration of the COVID-19 declaration under Section 564(b)(1) of the Act, 21 U.S.C. section 360bbb-3(b)(1), unless the authorization is terminated or revoked sooner. Performed  at [facility], [place] [address] [place], [place] [address]   MRSA PCR Screening     Status: Abnormal   Collection Time: 12/20/18  4:00 AM   Specimen: Nasopharyngeal  Result Value Ref Range Status   MRSA by PCR POSITIVE (A) NEGATIVE Final    Comment: CRITICAL RESULT CALLED TO, READ BACK BY AND VERIFIED WITH: RNBernarda Caffey [phone] @[phone]  THANEY Performed at [facility] [address] [place], [place] [address]   C difficile quick scan w PCR reflex     Status: None   Collection Time: 12/20/18  5:49 AM   Specimen: STOOL  Result Value Ref Range Status   C Diff antigen NEGATIVE NEGATIVE Final   C Diff toxin NEGATIVE NEGATIVE Final   C Diff interpretation No C. difficile detected.  Final    Comment: Performed at [facility], [place] [address] [place], [place] [address]  Gastrointestinal Panel by PCR , Stool     Status: None   Collection Time: 12/20/18  5:49 AM   Specimen: STOOL  Result Value Ref Range Status   Campylobacter species NOT DETECTED NOT DETECTED Final   Plesimonas shigelloides NOT DETECTED NOT DETECTED Final   Salmonella species NOT DETECTED NOT DETECTED Final   Yersinia enterocolitica NOT DETECTED NOT DETECTED Final   Vibrio species NOT DETECTED NOT DETECTED Final   Vibrio cholerae NOT DETECTED NOT DETECTED Final   Enteroaggregative E coli (EAEC) NOT DETECTED NOT DETECTED Final   Enteropathogenic E coli (EPEC) NOT DETECTED NOT DETECTED Final   Enterotoxigenic E coli (ETEC) NOT DETECTED NOT DETECTED Final   Shiga like toxin producing E coli (STEC) NOT DETECTED NOT DETECTED Final   Shigella/Enteroinvasive E coli (EIEC) NOT DETECTED NOT DETECTED Final   Cryptosporidium NOT DETECTED NOT DETECTED Final   Cyclospora cayetanensis NOT DETECTED NOT DETECTED Final   Entamoeba histolytica NOT DETECTED NOT DETECTED Final   Giardia lamblia NOT DETECTED NOT DETECTED Final   Adenovirus F40/41 NOT DETECTED NOT DETECTED Final   Astrovirus NOT DETECTED NOT DETECTED  Final   Norovirus GI/GII NOT DETECTED NOT DETECTED Final   Rotavirus A NOT DETECTED NOT DETECTED Final   Sapovirus (I, II, IV, and V) NOT DETECTED NOT DETECTED Final    Comment: Performed at [facility], [address], [place], [place] [address]      Radiology Studies: Ct Abdomen Pelvis Wo Contrast  Result Date: 12/20/2018 CLINICAL DATA:  Low hemoglobin labs COPD, CKD stage IV, history of colon cancer status post hemicolectomy and current ileostomyDiarrhea EXAM: CT ABDOMEN AND PELVIS WITHOUT CONTRAST TECHNIQUE: Multidetector CT imaging of the abdomen and pelvis was performed following the standard protocol without IV contrast. COMPARISON:  CT 02/23/2018 FINDINGS: Lower chest: Bibasilar pleuroparenchymal thickening. No pneumonia or aspiration pneumonitis. Emphysema noted. Hepatobiliary: No focal hepatic lesion on noncontrast exam. Gallbladder is distended 5 cm which is similar to comparison exam. There is a large gallstone within the lumen the gallbladder. No evidence of acute inflammation. No biliary duct dilatation evident. Pancreas: Pancreas is normal. No ductal dilatation. No pancreatic inflammation. Spleen: Normal spleen Adrenals/urinary tract: Adrenal glands normal. The bladder is distended. There is mild hydronephrosis and hydroureter on the RIGHT. Mild hydroureter on the LEFT. No obstructing lesion identified. Nonobstructing calculus in the RIGHT kidney. Stomach/Bowel: Stomach, duodenum and small-bowel normal. No evidence of bowel obstruction. There is a enteric colonic anastomosis in the RIGHT lower quadrant. There is colostomy in the LEFT abdominal wall. No free air. There is a large diverticulum along the descending colon (image 20/3) without acute inflammation. Vascular/Lymphatic: Abdominal aorta is normal caliber with atherosclerotic calcification. There is no retroperitoneal or periportal lymphadenopathy. No pelvic lymphadenopathy. Reproductive: Prostate small Other: No free  fluid. Musculoskeletal: No aggressive osseous lesion. IMPRESSION: 1. Bladder distension and mild bilateral hydroureter suggest bladder outlet obstruction. 2. Partial colectomy with LEFT lower quadrant ostomy. No evidence of bowel obstruction. Diverticulosis of LEFT colon without evidence diverticulitis. 3. Distended gallbladder without acute inflammation. Gallstone noted. Electronically Signed   By: Suzy Bouchard M.D.   On: 12/20/2018 12:10   Dg Chest Port 1 View  Result Date: 12/20/2018 CLINICAL DATA:  78 year old male with history of COVID-19 last month presenting with shortness of breath and low hemoglobin. EXAM: PORTABLE CHEST 1 VIEW COMPARISON:  Chest radiograph dated 11/25/2018 and 11/22/2018 FINDINGS: There is severe emphysema and biapical scarring. No consolidative changes. There is no pleural effusion or pneumothorax. The cardiac silhouette is within normal limits. Coronary vascular calcification and atherosclerotic calcification of the aorta. Osteopenia. No acute  osseous pathology. Right pectoral Port-A-Cath with tip at the cavoatrial junction. IMPRESSION: 1. No acute cardiopulmonary process. 2. Emphysema. Electronically Signed   By: Anner Crete M.D.   On: 12/20/2018 00:16      Scheduled Meds:  Chlorhexidine Gluconate Cloth  6 each Topical Q0600   dextrose       feeding supplement (ENSURE ENLIVE)  237 mL Oral BID BM   fluticasone  1 spray Each Nare Daily   mouth rinse  15 mL Mouth Rinse BID   multivitamin  15 mL Oral Daily   Teduglutide (rDNA)  1.2 mg Subcutaneous Daily   umeclidinium bromide  1 puff Inhalation Daily   Continuous Infusions:  sodium chloride Stopped (12/21/18 0855)   sodium bicarbonate 150 mEq in dextrose 5% 1000 mL 125 mL/hr at 12/21/18 0900     LOS: 0 days      Time spent: 35 minutes   Dessa Phi, DO [facility] [email or web address] 12/21/2018, 10:43 AM

## 2018-12-21 NOTE — Progress Notes (Signed)
Assisted tele visit to patient with daughter.  Asir Bingley M, RN  

## 2018-12-21 NOTE — Progress Notes (Signed)
CRITICAL VALUE ALERT  Critical Value:  K 2.6 and Ca 6.2  Date & Time Notied:  12/21/2018 @05 :30  Provider Notified: Alger Memos, NP  Orders Received/Actions taken: Electrolytes replaced

## 2018-12-21 NOTE — Progress Notes (Signed)
Assisted tele visit to patient with daughter.  Yoltzin Barg M, RN  

## 2018-12-22 LAB — CBC
HCT: 25.8 % — ABNORMAL LOW (ref 39.0–52.0)
Hemoglobin: 8 g/dL — ABNORMAL LOW (ref 13.0–17.0)
MCH: 28.7 pg (ref 26.0–34.0)
MCHC: 31 g/dL (ref 30.0–36.0)
MCV: 92.5 fL (ref 80.0–100.0)
Platelets: 415 10*3/uL — ABNORMAL HIGH (ref 150–400)
RBC: 2.79 MIL/uL — ABNORMAL LOW (ref 4.22–5.81)
RDW: 17.8 % — ABNORMAL HIGH (ref 11.5–15.5)
WBC: 9.6 10*3/uL (ref 4.0–10.5)
nRBC: 0 % (ref 0.0–0.2)

## 2018-12-22 LAB — BASIC METABOLIC PANEL
Anion gap: 8 (ref 5–15)
BUN: 12 mg/dL (ref 8–23)
CO2: 23 mmol/L (ref 22–32)
Calcium: 6.5 mg/dL — ABNORMAL LOW (ref 8.9–10.3)
Chloride: 111 mmol/L (ref 98–111)
Creatinine, Ser: 2.15 mg/dL — ABNORMAL HIGH (ref 0.61–1.24)
GFR calc Af Amer: 33 mL/min — ABNORMAL LOW (ref >60)
GFR calc non Af Amer: 28 mL/min — ABNORMAL LOW (ref >60)
Glucose, Bld: 89 mg/dL (ref 70–99)
Potassium: 5 mmol/L (ref 3.5–5.1)
Sodium: 142 mmol/L (ref 135–145)

## 2018-12-22 LAB — GLUCOSE, CAPILLARY
Glucose-Capillary: 56 mg/dL — ABNORMAL LOW (ref 70–99)
Glucose-Capillary: 46 mg/dL — ABNORMAL LOW (ref 70–99)
Glucose-Capillary: 52 mg/dL — ABNORMAL LOW (ref 70–99)
Glucose-Capillary: 60 mg/dL — ABNORMAL LOW (ref 70–99)
Glucose-Capillary: 161 mg/dL — ABNORMAL HIGH (ref 70–99)
Glucose-Capillary: 82 mg/dL (ref 70–99)
Glucose-Capillary: 115 mg/dL — ABNORMAL HIGH (ref 70–99)
Glucose-Capillary: 103 mg/dL — ABNORMAL HIGH (ref 70–99)

## 2018-12-22 LAB — MAGNESIUM: Magnesium: 2 mg/dL (ref 1.7–2.4)

## 2018-12-22 MED ORDER — DEXTROSE 50 % IV SOLN
INTRAVENOUS | Status: AC
  Administered 2018-12-22: 25 mL
  Filled 2018-12-22: qty 50

## 2018-12-22 MED ORDER — CHLORHEXIDINE GLUCONATE CLOTH 2 % EX PADS
6.0000 | MEDICATED_PAD | Freq: Every day | CUTANEOUS | Status: DC
  Administered 2018-12-22 – 2018-12-24 (×3): 6 via TOPICAL

## 2018-12-22 MED ORDER — MUPIROCIN 2 % EX OINT
TOPICAL_OINTMENT | CUTANEOUS | Status: AC
  Filled 2018-12-22: qty 22

## 2018-12-22 MED ORDER — MUPIROCIN 2 % EX OINT
1.0000 "application " | TOPICAL_OINTMENT | Freq: Two times a day (BID) | CUTANEOUS | Status: DC
  Administered 2018-12-22 – 2018-12-25 (×7): 1 via NASAL
  Filled 2018-12-22: qty 22

## 2018-12-22 MED ORDER — TRAZODONE HCL 50 MG PO TABS
25.0000 mg | ORAL_TABLET | Freq: Once | ORAL | Status: AC
  Administered 2018-12-22: 25 mg via ORAL
  Filled 2018-12-22: qty 1

## 2018-12-22 MED ORDER — CHLORHEXIDINE GLUCONATE CLOTH 2 % EX PADS
6.0000 | MEDICATED_PAD | Freq: Every day | CUTANEOUS | Status: DC
  Administered 2018-12-22 – 2018-12-25 (×4): 6 via TOPICAL

## 2018-12-22 MED ORDER — PROCHLORPERAZINE EDISYLATE 10 MG/2ML IJ SOLN
10.0000 mg | Freq: Four times a day (QID) | INTRAMUSCULAR | Status: DC | PRN
  Administered 2018-12-22 – 2018-12-24 (×2): 10 mg via INTRAVENOUS
  Filled 2018-12-22 (×2): qty 2

## 2018-12-22 NOTE — Progress Notes (Addendum)
PROGRESS NOTE    Timothy Burns  XBJ:[id number] DOB: August 08, 1940 DOA: 12/19/2018 PCP: Myrlene Broker, MD     Brief Narrative:  Timothy Burns is a 78 y.o. male with medical history significant of dementia, COPD, CKD stage IV, depression, history of colon cancer status post hemicolectomy and current ileostomy, short gut syndrome, admitted for COVID pneumonitis last month, on 2 L supplemental oxygen at home chronically presenting to the hospital for evaluation of low hemoglobin/abnormal labs.  Outpatient labs done by PCP showing Hgb 7.  Daughter at bedside states patient has had a lot of stool output through his ileostomy for the past 1 week.  They are changing the bag multiple times daily.  They have also noted a lot of blood in the ileostomy bag a few times this week.  Patient has not had any fevers.  He denies any abdominal pain, nausea, or vomiting.  He has chronic shortness of breath for which he uses 2 L home oxygen intermittently during the day and always at night.  No cough or chest pain.  He is followed by outpatient nephrology.    COVID positive 7/10 at [facility], discharged from [place] to [place] ED 7/18 with SOB, transferred to [facility] 7/10, discharged 7/21  Tested positive this admission 8/14 Remains in airborne isolation this admission   New events last 24 hours / Subjective: No complaints today, breathing well. Blood sugar was low this morning 46, corrected with orange juice and half amp of D50.  Repeat blood sugar was 161.  Patient reported feeling a little shaky, but no dizziness lightheadedness diaphoresis or other complaints.  Assessment & Plan:   Principal Problem:   Hypokalemia Active Problems:   Hypomagnesemia   Hypocalcemia   QT prolongation   Metabolic acidosis   Electrolyte derangements including severe hypokalemia, severe hypomagnesemia, and hypocalcemia -History of colon cancer status post hemicolectomy and current ileostomy, short  gut syndrome.  He has been having increasing liquid stool output for the past 1 week.  Suspect electrolyte derangements are related to GI loss -Electrolytes replaced and normal this morning, potassium 5, magnesium 2   Increased stool output in the setting of history of hemicolectomy status post ileostomy, short gut syndrome -CT abdomen pelvis showed bladder distention, no evidence of bowel obstruction or diverticulitis -C. difficile PCR negative, GI pathogen panel  negative -?  Viral etiology -Continue home Gattex    Melena?  -Daughter reported large amounts of stool output over the past 1 week and episodes of bloody stool output.  Hemoglobin 8.4, no significant change since labs done 3 weeks ago.  FOBT negative.  Per EDP, looks like the blood was from irritation around his stoma.  No blood mixed in his stool. -Spoke with Dr. Silverio Decamp, no indication for inpatient GI consult at this time with stable Hgb and negative FOBT. Patient may follow up with Dr. Lyndel Safe.  -Hemoglobin stable this morning 8.0, no further signs of melanotic stools   Severe non-anion gap metabolic acidosis -Suspect related to GI losses -Resolved   AKI on CKD stage IV -Baseline creatinine around 2.3 -Improving with IV hydration Cr 3.3 --> 3.0 --> 2.58 --> 2.15 -CT abdomen pelvis revealed bladder distention. Foley catheter placed.  Discontinue Foley catheter today for voiding trial.   Dementia -At baseline can act bit confused and agitated when out of comfort zone, but overall mild dementia, per daughter.  -Stable today  Hypoglycemia -Treated and resolved    COVID-19 positive  -Patient was  admitted last month for COVID-19 viral infection. Continues to test positive. On 2 L supplemental oxygen -Continue home oxygen  Chronic hypoxemic respiratory failure -Intermittently uses 2L at baseline     DVT prophylaxis: SCD Code Status: Full Family Communication: Spoke with daughter over the phone for an  update Disposition Plan: Resolution of hypoglycemia, ostomy RN to evaluate today. PT eval ordered    Consultants:  GI over the phone only  Procedures:  None   Antimicrobials:  Anti-infectives (From admission, onward)    None        Objective: Vitals:   12/22/18 0700 12/22/18 0800 12/22/18 0900 12/22/18 1000  BP:  (!) 154/67  125/76  Pulse: 64 80 91 75  Resp: 19 (!) 24 (!) 21 (!) 24  Temp:  98.8 F (37.1 C)    TempSrc:  Oral    SpO2: 100% 100% 100% 100%  Weight:      Height:        Intake/Output Summary (Last 24 hours) at 12/22/2018 1047 Last data filed at 12/22/2018 0930 Gross per 24 hour  Intake 2471.17 ml  Output 3320 ml  Net -848.83 ml   Filed Weights   12/20/18 0356  Weight: 44 kg    Examination: General exam: Appears calm and comfortable  Respiratory system: Clear to auscultation. Respiratory effort normal. Cardiovascular system: S1 & S2 heard, RRR. No JVD, murmurs, rubs, gallops or clicks. No pedal edema. Gastrointestinal system: Abdomen is nondistended, soft and nontender. No organomegaly or masses felt. Normal bowel sounds heard. +ostomy with light green liquid stool  GU: foley with clear yellow urine  Central nervous system: Alert and oriented. No focal neurological deficits. Extremities: Symmetric 5 x 5 power. Skin: No rashes, lesions or ulcers Psychiatry: Judgement and insight appear normal. Mood & affect appropriate.    Data Reviewed: I have personally reviewed following labs and imaging studies  CBC: Recent Labs  Lab 12/19/18 2043 12/21/18 0456 12/22/18 0825  WBC 9.8 8.7 9.6  HGB 8.4* 7.9* 8.0*  HCT 27.7* 24.1* 25.8*  MCV 92.3 86.7 92.5  PLT 456* 402* 967*   Basic Metabolic Panel: Recent Labs  Lab 12/19/18 2043 12/20/18 0950 12/21/18 0456 12/21/18 1244 12/22/18 0825  NA 141 140 138  --  142  K 2.6* 3.0* 2.6* 3.0* 5.0  CL 116* 113* 100  --  111  CO2 10* 14* 25  --  23  GLUCOSE 90 143* 96  --  89  BUN 23 21 17   --  12   CREATININE 3.36* 3.03* 2.58*  --  2.15*  CALCIUM 6.2* 6.5* 6.1*  --  6.5*  MG 1.1* 2.3 1.8  --  2.0  PHOS  --  5.0*  --   --   --    GFR: Estimated Creatinine Clearance: 17.6 mL/min (A) (by C-G formula based on SCr of 2.15 mg/dL (H)). Liver Function Tests: Recent Labs  Lab 12/19/18 2043  AST 13*  ALT 9  ALKPHOS 62  BILITOT 0.4  PROT 6.5  ALBUMIN 2.5*   No results for input(s): LIPASE, AMYLASE in the last 168 hours. No results for input(s): AMMONIA in the last 168 hours. Coagulation Profile: No results for input(s): INR, PROTIME in the last 168 hours. Cardiac Enzymes: No results for input(s): CKTOTAL, CKMB, CKMBINDEX, TROPONINI in the last 168 hours. BNP (last 3 results) No results for input(s): PROBNP in the last 8760 hours. HbA1C: No results for input(s): HGBA1C in the last 72 hours. CBG: Recent Labs  Lab  12/22/18 0819 12/22/18 0843 12/22/18 0845 12/22/18 0925 12/22/18 0951  GLUCAP 56* 60* 46* 52* 161*   Lipid Profile: No results for input(s): CHOL, HDL, LDLCALC, TRIG, CHOLHDL, LDLDIRECT in the last 72 hours. Thyroid Function Tests: No results for input(s): TSH, T4TOTAL, FREET4, T3FREE, THYROIDAB in the last 72 hours. Anemia Panel: No results for input(s): VITAMINB12, FOLATE, FERRITIN, TIBC, IRON, RETICCTPCT in the last 72 hours. Sepsis Labs: No results for input(s): PROCALCITON, LATICACIDVEN in the last 168 hours.  Recent Results (from the past 240 hour(s))  SARS Coronavirus 2 [facility] order, Performed in [facility] hospital lab) Nasopharyngeal Nasopharyngeal Swab     Status: Abnormal   Collection Time: 12/19/18 11:48 PM   Specimen: Nasopharyngeal Swab  Result Value Ref Range Status   SARS Coronavirus 2 POSITIVE (A) NEGATIVE Final    Comment: RESULT CALLED TO, READ BACK BY AND VERIFIED WITH: P JOHNSTON RN 12/20/18 0110 JDW (NOTE) If result is NEGATIVE SARS-CoV-2 target nucleic acids are NOT DETECTED. The SARS-CoV-2 RNA is generally detectable in upper  and lower  respiratory specimens during the acute phase of infection. The lowest  concentration of SARS-CoV-2 viral copies this assay can detect is 250  copies / mL. A negative result does not preclude SARS-CoV-2 infection  and should not be used as the sole basis for treatment or other  patient management decisions.  A negative result may occur with  improper specimen collection / handling, submission of specimen other  than nasopharyngeal swab, presence of viral mutation(s) within the  areas targeted by this assay, and inadequate number of viral copies  (<250 copies / mL). A negative result must be combined with clinical  observations, patient history, and epidemiological information. If result is POSITIVE SARS-CoV-2 target nucleic acids are DETECTED. The SA RS-CoV-2 RNA is generally detectable in upper and lower  respiratory specimens during the acute phase of infection.  Positive  results are indicative of active infection with SARS-CoV-2.  Clinical  correlation with patient history and other diagnostic information is  necessary to determine patient infection status.  Positive results do  not rule out bacterial infection or co-infection with other viruses. If result is PRESUMPTIVE POSTIVE SARS-CoV-2 nucleic acids MAY BE PRESENT.   A presumptive positive result was obtained on the submitted specimen  and confirmed on repeat testing.  While 2019 novel coronavirus  (SARS-CoV-2) nucleic acids may be present in the submitted sample  additional confirmatory testing may be necessary for epidemiological  and / or clinical management purposes  to differentiate between  SARS-CoV-2 and other Sarbecovirus currently known to infect humans.  If clinically indicated additional testing with an alternate test  methodology [phone]) is advi sed. The SARS-CoV-2 RNA is generally  detectable in upper and lower respiratory specimens during the acute  phase of infection. The expected result is  Negative. Fact Sheet for Patients:  StrictlyIdeas.no Fact Sheet for Healthcare Providers: BankingDealers.co.za This test is not yet approved or cleared by the [place] FDA and has been authorized for detection and/or diagnosis of SARS-CoV-2 by FDA under an Emergency Use Authorization (EUA).  This EUA will remain in effect (meaning this test can be used) for the duration of the COVID-19 declaration under Section 564(b)(1) of the Act, 21 U.S.C. section 360bbb-3(b)(1), unless the authorization is terminated or revoked sooner. Performed at [facility], [place] [address], [place], [place] [address]   MRSA PCR Screening     Status: Abnormal   Collection Time: 12/20/18  4:00 AM  Specimen: Nasopharyngeal  Result Value Ref Range Status   MRSA by PCR POSITIVE (A) NEGATIVE Final    Comment: CRITICAL RESULT CALLED TO, READ BACK BY AND VERIFIED WITH: Loretha Brasil [id number] @0552  [facility] Performed at [facility], [place] [address] [place], [place] [address]   C difficile quick scan w PCR reflex     Status: None   Collection Time: 12/20/18  5:49 AM   Specimen: STOOL  Result Value Ref Range Status   C Diff antigen NEGATIVE NEGATIVE Final   C Diff toxin NEGATIVE NEGATIVE Final   C Diff interpretation No C. difficile detected.  Final    Comment: Performed at [facility], [place] [address], [place], [place] [address]  Gastrointestinal Panel by PCR , Stool     Status: None   Collection Time: 12/20/18  5:49 AM   Specimen: STOOL  Result Value Ref Range Status   Campylobacter species NOT DETECTED NOT DETECTED Final   Plesimonas shigelloides NOT DETECTED NOT DETECTED Final   Salmonella species NOT DETECTED NOT DETECTED Final   Yersinia enterocolitica NOT DETECTED NOT DETECTED Final   Vibrio species NOT DETECTED NOT DETECTED Final   Vibrio cholerae NOT DETECTED NOT DETECTED Final   Enteroaggregative E coli (EAEC) NOT DETECTED NOT  DETECTED Final   Enteropathogenic E coli (EPEC) NOT DETECTED NOT DETECTED Final   Enterotoxigenic E coli (ETEC) NOT DETECTED NOT DETECTED Final   Shiga like toxin producing E coli (STEC) NOT DETECTED NOT DETECTED Final   Shigella/Enteroinvasive E coli (EIEC) NOT DETECTED NOT DETECTED Final   Cryptosporidium NOT DETECTED NOT DETECTED Final   Cyclospora cayetanensis NOT DETECTED NOT DETECTED Final   Entamoeba histolytica NOT DETECTED NOT DETECTED Final   Giardia lamblia NOT DETECTED NOT DETECTED Final   Adenovirus F40/41 NOT DETECTED NOT DETECTED Final   Astrovirus NOT DETECTED NOT DETECTED Final   Norovirus GI/GII NOT DETECTED NOT DETECTED Final   Rotavirus A NOT DETECTED NOT DETECTED Final   Sapovirus (I, II, IV, and V) NOT DETECTED NOT DETECTED Final    Comment: Performed at [facility], [address] [place], [place] [address]      Radiology Studies: Ct Abdomen Pelvis Wo Contrast  Result Date: 12/20/2018 CLINICAL DATA:  Low hemoglobin labs COPD, CKD stage IV, history of colon cancer status post hemicolectomy and current ileostomyDiarrhea EXAM: CT ABDOMEN AND PELVIS WITHOUT CONTRAST TECHNIQUE: Multidetector CT imaging of the abdomen and pelvis was performed following the standard protocol without IV contrast. COMPARISON:  CT 02/23/2018 FINDINGS: Lower chest: Bibasilar pleuroparenchymal thickening. No pneumonia or aspiration pneumonitis. Emphysema noted. Hepatobiliary: No focal hepatic lesion on noncontrast exam. Gallbladder is distended 5 cm which is similar to comparison exam. There is a large gallstone within the lumen the gallbladder. No evidence of acute inflammation. No biliary duct dilatation evident. Pancreas: Pancreas is normal. No ductal dilatation. No pancreatic inflammation. Spleen: Normal spleen Adrenals/urinary tract: Adrenal glands normal. The bladder is distended. There is mild hydronephrosis and hydroureter on the RIGHT. Mild hydroureter on the LEFT. No obstructing  lesion identified. Nonobstructing calculus in the RIGHT kidney. Stomach/Bowel: Stomach, duodenum and small-bowel normal. No evidence of bowel obstruction. There is a enteric colonic anastomosis in the RIGHT lower quadrant. There is colostomy in the LEFT abdominal wall. No free air. There is a large diverticulum along the descending colon (image 20/3) without acute inflammation. Vascular/Lymphatic: Abdominal aorta is normal caliber with atherosclerotic calcification. There is no retroperitoneal or periportal lymphadenopathy. No pelvic lymphadenopathy.  Reproductive: Prostate small Other: No free fluid. Musculoskeletal: No aggressive osseous lesion. IMPRESSION: 1. Bladder distension and mild bilateral hydroureter suggest bladder outlet obstruction. 2. Partial colectomy with LEFT lower quadrant ostomy. No evidence of bowel obstruction. Diverticulosis of LEFT colon without evidence diverticulitis. 3. Distended gallbladder without acute inflammation. Gallstone noted. Electronically Signed   By: Suzy Bouchard M.D.   On: 12/20/2018 12:10      Scheduled Meds:  Chlorhexidine Gluconate Cloth  6 each Topical Q0600   Chlorhexidine Gluconate Cloth  6 each Topical Q0600   fluticasone  1 spray Each Nare Daily   lactose free nutrition  237 mL Oral TID WC   mouth rinse  15 mL Mouth Rinse BID   multivitamin  15 mL Oral Daily   mupirocin ointment  1 application Nasal BID   Teduglutide (rDNA)  1.2 mg Subcutaneous Daily   umeclidinium bromide  1 puff Inhalation Daily   Continuous Infusions:  sodium chloride Stopped (12/21/18 1228)     LOS: 1 day      Time spent: 35 minutes   Dessa Phi, DO [facility] [email or web address] 12/22/2018, 10:47 AM

## 2018-12-22 NOTE — Progress Notes (Signed)
Report called to 2w32 Katie RN. I also called his daughter Butch Penny to inform her of the transfer

## 2018-12-22 NOTE — Consult Note (Signed)
[facility] Nurse ostomy follow up Patient is COVID + and receiving care in [facility] 2M11. Stoma type/location: ileostomy per H&P note Stomal assessment/size: unknown Peristomal assessment: deferred until primary RN is available to coordinate with [facility] for stoma/peristoma assessment Treatment options for stomal/peristomal skin: Crusting, barrier ring Output:  Unknown at this time  Ostomy pouching: I have asked the Unit Secretary to order the following items for ostomy care:  4 barrier rings Kellie Simmering [phone]); 1 bottle of stoma powder Kellie Simmering #6); 1 box of skin barrier wipes Kellie Simmering [phone]); 3 Convex pouches Kellie Simmering [phone]; 2 high output pouches Kellie Simmering [phone]); 2 skin barriers Kellie Simmering #2).  With these various supplies we should be able to develop a plan to address pouching and skin issues.  I am awaiting arrival of the supplies and availability of the primary RN in order to complete the consult via Telehealth.  Val Riles, RN, MSN, CWOCN, CNS-BC, pager [phone]

## 2018-12-22 NOTE — Progress Notes (Signed)
Pt feeling nauseous after eating dinner, requesting antiemetic.    MD notified and new orders received.

## 2018-12-22 NOTE — Progress Notes (Signed)
Pt transferred from 20M. Patient alert and oriented, no complaints, and eating dinner.  VSS.

## 2018-12-22 NOTE — Progress Notes (Signed)
Assisted tele visit to patient with family member.  Murrel Freet M, RN  

## 2018-12-22 NOTE — Progress Notes (Signed)
Patient transferred to 2w32 via bed tolerated transfer well

## 2018-12-22 NOTE — Consult Note (Signed)
[facility] [place] Nurse ostomy follow up Stoma type/location: LLQ Stomal assessment/size: budded, pink, producing liquid effluent Peristomal assessment: intact Ostomy pouching: 2 piece flat high output used. Patient also has 2 one piece convex pouches.  I have placed an order for 2 regular fecal pouches that coordinate with the skin barrier for the 2 3/4 inch system. Consult completed remotely via [place] and bedside RN, Silva Bandy.  I appreciate Silva Bandy' expertise in placing the new pouching system.  Thank you for the consult.  Discussed plan of care with the patient and bedside nurse.  [facility] nurse will not follow at this time.  Please re-consult the [facility] team if needed.  Val Riles, RN, MSN, CWOCN, CNS-BC, pager [phone]

## 2018-12-22 NOTE — Progress Notes (Signed)
Hypoglycemic Event  CBG: 56 Right Hand 46 Left Hand   Treatment: 4oz of orange juice   Symptoms: no symptoms, low blood sugar   Follow-up CBG: [id number] CBG Result: 60 Right hand 46 Left hand   Possible Reasons for Event: unknown   Comments/MD notified: Dr. Laverle Patter

## 2018-12-23 ENCOUNTER — Encounter (HOSPITAL_COMMUNITY): Payer: Self-pay

## 2018-12-23 ENCOUNTER — Other Ambulatory Visit: Payer: Self-pay

## 2018-12-23 LAB — RETICULOCYTES
Immature Retic Fract: 14.7 % (ref 2.3–15.9)
RBC.: 2.74 MIL/uL — ABNORMAL LOW (ref 4.22–5.81)
Retic Count, Absolute: 65.5 10*3/uL (ref 19.0–186.0)
Retic Ct Pct: 2.4 % (ref 0.4–3.1)

## 2018-12-23 LAB — HEPATIC FUNCTION PANEL
ALT: 9 U/L (ref 0–44)
AST: 14 U/L — ABNORMAL LOW (ref 15–41)
Albumin: 2 g/dL — ABNORMAL LOW (ref 3.5–5.0)
Alkaline Phosphatase: 62 U/L (ref 38–126)
Bilirubin, Direct: <0.1 mg/dL (ref 0.0–0.2)
Total Bilirubin: 0.7 mg/dL (ref 0.3–1.2)
Total Protein: 5.6 g/dL — ABNORMAL LOW (ref 6.5–8.1)

## 2018-12-23 LAB — CBC
HCT: 23.4 % — ABNORMAL LOW (ref 39.0–52.0)
Hemoglobin: 6.9 g/dL — CL (ref 13.0–17.0)
MCH: 28.4 pg (ref 26.0–34.0)
MCHC: 29.5 g/dL — ABNORMAL LOW (ref 30.0–36.0)
MCV: 96.3 fL (ref 80.0–100.0)
Platelets: 303 10*3/uL (ref 150–400)
RBC: 2.43 MIL/uL — ABNORMAL LOW (ref 4.22–5.81)
RDW: 17.6 % — ABNORMAL HIGH (ref 11.5–15.5)
WBC: 8 10*3/uL (ref 4.0–10.5)
nRBC: 0 % (ref 0.0–0.2)

## 2018-12-23 LAB — BASIC METABOLIC PANEL
Anion gap: 9 (ref 5–15)
BUN: 16 mg/dL (ref 8–23)
CO2: 19 mmol/L — ABNORMAL LOW (ref 22–32)
Calcium: 6.3 mg/dL — CL (ref 8.9–10.3)
Chloride: 111 mmol/L (ref 98–111)
Creatinine, Ser: 2.03 mg/dL — ABNORMAL HIGH (ref 0.61–1.24)
GFR calc Af Amer: 35 mL/min — ABNORMAL LOW (ref >60)
GFR calc non Af Amer: 30 mL/min — ABNORMAL LOW (ref >60)
Glucose, Bld: 96 mg/dL (ref 70–99)
Potassium: 4.4 mmol/L (ref 3.5–5.1)
Sodium: 139 mmol/L (ref 135–145)

## 2018-12-23 LAB — GLUCOSE, CAPILLARY
Glucose-Capillary: 109 mg/dL — ABNORMAL HIGH (ref 70–99)
Glucose-Capillary: 126 mg/dL — ABNORMAL HIGH (ref 70–99)
Glucose-Capillary: 44 mg/dL — CL (ref 70–99)
Glucose-Capillary: 78 mg/dL (ref 70–99)
Glucose-Capillary: 89 mg/dL (ref 70–99)
Glucose-Capillary: 95 mg/dL (ref 70–99)
Glucose-Capillary: 98 mg/dL (ref 70–99)

## 2018-12-23 LAB — VITAMIN B12: Vitamin B-12: 233 pg/mL (ref 180–914)

## 2018-12-23 LAB — PREPARE RBC (CROSSMATCH)

## 2018-12-23 LAB — LACTATE DEHYDROGENASE: LDH: 206 U/L — ABNORMAL HIGH (ref 98–192)

## 2018-12-23 LAB — SAVE SMEAR(SSMR), FOR PROVIDER SLIDE REVIEW

## 2018-12-23 LAB — IRON AND TIBC
Iron: 16 ug/dL — ABNORMAL LOW (ref 45–182)
Saturation Ratios: 8 % — ABNORMAL LOW (ref 17.9–39.5)
TIBC: 195 ug/dL — ABNORMAL LOW (ref 250–450)
UIBC: 179 ug/dL

## 2018-12-23 LAB — HEMOGLOBIN AND HEMATOCRIT, BLOOD
HCT: 28.3 % — ABNORMAL LOW (ref 39.0–52.0)
Hemoglobin: 8.8 g/dL — ABNORMAL LOW (ref 13.0–17.0)

## 2018-12-23 LAB — DIRECT ANTIGLOBULIN TEST (NOT AT ARMC)
DAT, IgG: NEGATIVE
DAT, complement: NEGATIVE

## 2018-12-23 LAB — FERRITIN: Ferritin: 80 ng/mL (ref 24–336)

## 2018-12-23 LAB — MAGNESIUM: Magnesium: 1.7 mg/dL (ref 1.7–2.4)

## 2018-12-23 MED ORDER — SODIUM CHLORIDE 0.9% IV SOLUTION
Freq: Once | INTRAVENOUS | Status: AC
  Administered 2018-12-23: 16:00:00 via INTRAVENOUS

## 2018-12-23 MED ORDER — MAGNESIUM SULFATE 2 GM/50ML IV SOLN
2.0000 g | Freq: Once | INTRAVENOUS | Status: AC
  Administered 2018-12-23: 2 g via INTRAVENOUS
  Filled 2018-12-23: qty 50

## 2018-12-23 NOTE — Evaluation (Signed)
Physical Therapy Evaluation Patient Details Name: Timothy Burns MRN: [id number] DOB: [birth year] Today's Date: 12/23/2018   History of Present Illness  Timothy Burns is a 78 y.o. male with medical history significant of dementia, COPD, CKD stage IV, depression, history of colon cancer status post hemicolectomy and current ileostomy, short gut syndrome, admitted for COVID pneumonitis last month, on 2 L supplemental oxygen at home chronically presenting to the hospital for evaluation of low hemoglobin/abnormal labs.  Clinical Impression  Patient presents with decreased endurance and generalized weakness limiting independence and safety with mobility.  He will benefit from skilled PT in the acute setting and from follow up HHPT to progress with safety, endurance with mobility in the home environment.  PT to follow.    Follow Up Recommendations Home health PT    Equipment Recommendations  None recommended by PT    Recommendations for Other Services       Precautions / Restrictions Precautions Precautions: Fall Precaution Comments: reports falls x 2 little more than 6 months ago, on home O2      Mobility  Bed Mobility Overal bed mobility: Modified Independent                Transfers   Equipment used: None Transfers: Sit to/from Stand Sit to Stand: Supervision            Ambulation/Gait Ambulation/Gait assistance: Min guard;Supervision Gait Distance (Feet): 60 Feet(x 2 laps in room) Assistive device: None Gait Pattern/deviations: Step-through pattern;Decreased stride length     General Gait Details: pushing IV pole in the room and demonstrating mild unsteadiness with turns giving minguard for safety  Stairs            Wheelchair Mobility    Modified Rankin (Stroke Patients Only)       Balance Overall balance assessment: Mild deficits observed, not formally tested                                           Pertinent Vitals/Pain  Pain Assessment: Faces Faces Pain Scale: Hurts little more Pain Location: legs (restless) Pain Descriptors / Indicators: Aching Pain Intervention(s): Monitored during session;Repositioned    Home Living Family/patient expects to be discharged to:: Private residence Living Arrangements: Spouse/significant other Available Help at Discharge: Family;Available 24 hours/day Type of Home: House Home Access: Stairs to enter Entrance Stairs-Rails: Right Entrance Stairs-Number of Steps: 2 Home Layout: One level Home Equipment: Walker - 2 wheels;Grab bars - tub/shower Additional Comments: denies that he has a shower seat, but notes from last admission report he has shower seat and HH shower.    Prior Function Level of Independence: Independent               Hand Dominance        Extremity/Trunk Assessment        Lower Extremity Assessment Lower Extremity Assessment: LLE deficits/detail LLE Deficits / Details: limited ankle DF wtih strength grossly 3-/5, otherwise WFL       Communication   Communication: HOH  Cognition Arousal/Alertness: Awake/alert Behavior During Therapy: WFL for tasks assessed/performed Overall Cognitive Status: Within Functional Limits for tasks assessed                                 General Comments: generally WFL, but PMH positive for dementia  General Comments General comments (skin integrity, edema, etc.): JHR 96, SpO2 100% on 1LPM at rest, ambulated on 2L O2 with SpO2 100% and HR 116    Exercises     Assessment/Plan    PT Assessment Patient needs continued PT services  PT Problem List Decreased strength;Decreased activity tolerance;Decreased balance;Decreased mobility;Decreased knowledge of use of DME;Cardiopulmonary status limiting activity       PT Treatment Interventions DME instruction;Gait training;Functional mobility training;Therapeutic activities;Therapeutic exercise;Balance training;Cognitive  remediation;Patient/family education    PT Goals (Current goals can be found in the Care Plan section)  Acute Rehab PT Goals Patient Stated Goal: eager for home asap PT Goal Formulation: With patient Time For Goal Achievement: 12/30/18 Potential to Achieve Goals: Good    Frequency Min 3X/week   Barriers to discharge        Co-evaluation               AM-PAC PT "6 Clicks" Mobility  Outcome Measure Help needed turning from your back to your side while in a flat bed without using bedrails?: None Help needed moving from lying on your back to sitting on the side of a flat bed without using bedrails?: None Help needed moving to and from a bed to a chair (including a wheelchair)?: A Little Help needed standing up from a chair using your arms (e.g., wheelchair or bedside chair)?: None Help needed to walk in hospital room?: A Little Help needed climbing 3-5 steps with a railing? : A Little 6 Click Score: 21    End of Session Equipment Utilized During Treatment: Oxygen Activity Tolerance: Patient tolerated treatment well Patient left: in bed;with call bell/phone within reach   PT Visit Diagnosis: Unsteadiness on feet (R26.81);Muscle weakness (generalized) (M62.81)    Time: 1040-1105 PT Time Calculation (min) (ACUTE ONLY): 25 min   Charges:   PT Evaluation $PT Eval Low Complexity: 1 Low PT Treatments $Gait Training: 8-22 mins        Magda Kiel, [place] Acute [facility] [phone] 12/23/2018   Reginia Naas 12/23/2018, 11:40 AM

## 2018-12-23 NOTE — Progress Notes (Signed)
Called pt daughter twice today, 1 before blood administration and one during the completion at [phone]. The first time after several attempts was able to connect Was not able to reach her the second time. Then daughter called back and requested updates from MD. Communication sent.

## 2018-12-23 NOTE — Plan of Care (Signed)
  Problem: Clinical Measurements: Goal: Respiratory complications will improve Outcome: Progressing   Problem: Education: Goal: Knowledge of General Education information will improve Description: Including pain rating scale, medication(s)/side effects and non-pharmacologic comfort measures Outcome: Not Progressing   Problem: Activity: Goal: Risk for activity intolerance will decrease Outcome: Not Progressing   Problem: Nutrition: Goal: Adequate nutrition will be maintained Outcome: Not Progressing   Problem: Pain Managment: Goal: General experience of comfort will improve Outcome: Not Progressing

## 2018-12-23 NOTE — Progress Notes (Signed)
Beckman Coulter [id number] Hemoccult stool-negative.

## 2018-12-23 NOTE — Progress Notes (Signed)
CRITICAL VALUE ALERT  Critical Value:  Hemoglobin - 6.9  Date & Time Notied:  12/23/2018 @ 4884  Provider Notified: Tylene Fantasia  Orders Received/Actions taken: Awaiting instructions.

## 2018-12-23 NOTE — Progress Notes (Signed)
OT Cancellation Note  Patient Details Name: Timothy Burns MRN: [id number] DOB: 1940-10-29   Cancelled Treatment:    Reason Eval/Treat Not Completed: Medical issues which prohibited therapy; noted pt with low Hgb, spoke with RN who reports pt currently receiving blood, asked to hold at this time. Will follow up for OT eval as able.  Lou Cal, OT [facility] Pager [phone] Office [place] 12/23/2018, 4:10 PM

## 2018-12-23 NOTE — Progress Notes (Signed)
PROGRESS NOTE    Timothy Burns  HBZ:[id number] DOB: 1940/11/24 DOA: 12/19/2018 PCP: Myrlene Broker, MD     Brief Narrative:  Timothy Burns is a 78 y.o. male with medical history significant of dementia, COPD, CKD stage IV, depression, history of colon cancer status post hemicolectomy and current ileostomy, short gut syndrome, admitted for COVID pneumonitis last month, on 2 L supplemental oxygen at home chronically presenting to the hospital for evaluation of low hemoglobin/abnormal labs.  Outpatient labs done by PCP showing Hgb 7.  Daughter at bedside states patient has had a lot of stool output through his ileostomy for the past 1 week.  They are changing the bag multiple times daily.  They have also noted a lot of blood in the ileostomy bag a few times this week.  Patient has not had any fevers.  He denies any abdominal pain, nausea, or vomiting.  He has chronic shortness of breath for which he uses 2 L home oxygen intermittently during the day and always at night.  No cough or chest pain.  He is followed by outpatient nephrology.    COVID positive 7/10 at [facility], discharged from [place] [facility] [place] [facility] 7/18 with SOB, transferred to [facility] 7/10, discharged 7/21  Tested positive this admission 8/14 Remains in airborne isolation this admission   New events last 24 hours / Subjective: Feeling well today.  However, hemoglobin dropped to 6.9 overnight.  He has not noticed any blood from his ileostomy bag.  No new complaints today.  Assessment & Plan:   Principal Problem:   Hypokalemia Active Problems:   Hypomagnesemia   Hypocalcemia   QT prolongation   Metabolic acidosis   Electrolyte derangements including severe hypokalemia, severe hypomagnesemia, and hypocalcemia -History of colon cancer status post hemicolectomy and current ileostomy, short gut syndrome.  He has been having increasing liquid stool output for the past 1 week.  Suspect electrolyte  derangements are related to GI loss -Electrolytes improved.  Replace magnesium today   Increased stool output in the setting of history of hemicolectomy status post ileostomy, short gut syndrome -CT abdomen pelvis showed bladder distention, no evidence of bowel obstruction or diverticulitis -C. difficile PCR negative, GI pathogen panel negative -?  Viral etiology -Continue home Gattex for short gut syndrome -Improved   Acute on chronic normocytic anemia -Daughter reported large amounts of stool output over the past 1 week and episodes of bloody stool output.  Hemoglobin 8.4 on admission, no significant change since labs done 3 weeks ago.  FOBT negative.  Per EDP, looks like the blood was from irritation around his stoma.  No blood mixed in his stool. -Spoke with Dr. Silverio Decamp, GI, no indication for inpatient GI consult at this time with stable Hgb and negative FOBT. Patient may follow up with Dr. Lyndel Safe.  -Hemoglobin dropped to 6.9 this morning.  Repeat FOBT, hemolysis labs.  Transfuse 1 unit packed red blood cells today   Severe non-anion gap metabolic acidosis -Suspect related to GI losses -Resolved    AKI on CKD stage IV -Baseline creatinine around 2.3 -CT abdomen pelvis revealed bladder distention. Foley catheter placed and now discontinued   -Improving with IV hydration Cr 3.3 --> 3.0 --> 2.58 --> 2.15 --> 2.03   Dementia -At baseline can act bit confused and agitated when out of comfort zone, but overall mild dementia, per daughter.  -Stable today  Hypoglycemia -Treated and resolved    COVID-19 positive  -Patient was admitted last  month for COVID-19 viral infection. Continues to test positive. On 2 L supplemental oxygen -Continue home oxygen  Chronic hypoxemic respiratory failure -Intermittently uses 2L at baseline     DVT prophylaxis: SCD Code Status: Full Family Communication: Spoke with daughter over the phone for an update Disposition Plan: Anemia work-up    Consultants:   GI over the phone only  Procedures:   None   Antimicrobials:  Anti-infectives (From admission, onward)   None       Objective: Vitals:   12/22/18 1700 12/22/18 1805 12/22/18 2119 12/23/18 1011  BP:  127/71 128/70 114/74  Pulse: 78 86 90 76  Resp: 16   12  Temp:  97.7 F (36.5 C) 98.5 F (36.9 C) 97.7 F (36.5 C)  TempSrc:  Oral Oral Oral  SpO2: 98%   100%  Weight:      Height:        Intake/Output Summary (Last 24 hours) at 12/23/2018 1135 Last data filed at 12/23/2018 0302 Gross per 24 hour  Intake 440 ml  Output 1300 ml  Net -860 ml   Filed Weights   12/20/18 0356  Weight: 44 kg    Examination: General exam: Appears calm and comfortable  Respiratory system: Clear to auscultation. Respiratory effort normal. Cardiovascular system: S1 & S2 heard, RRR. No JVD, murmurs, rubs, gallops or clicks. No pedal edema. Gastrointestinal system: Abdomen is nondistended, soft and nontender. No organomegaly or masses felt. Normal bowel sounds heard. + Ileostomy bag with loose brown stool Central nervous system: Alert and oriented. No focal neurological deficits. Extremities: Symmetric 5 x 5 power. Skin: No rashes, lesions or ulcers Psychiatry: Judgement and insight appear normal. Mood & affect appropriate.    Data Reviewed: I have personally reviewed following labs and imaging studies  CBC: Recent Labs  Lab 12/19/18 2043 12/21/18 0456 12/22/18 0825 12/23/18 0505  WBC 9.8 8.7 9.6 8.0  HGB 8.4* 7.9* 8.0* 6.9*  HCT 27.7* 24.1* 25.8* 23.4*  MCV 92.3 86.7 92.5 96.3  PLT 456* 402* 415* 378   Basic Metabolic Panel: Recent Labs  Lab 12/19/18 2043 12/20/18 0950 12/21/18 0456 12/21/18 1244 12/22/18 0825 12/23/18 0505  NA 141 140 138  --  142 139  K 2.6* 3.0* 2.6* 3.0* 5.0 4.4  CL 116* 113* 100  --  111 111  CO2 10* 14* 25  --  23 19*  GLUCOSE 90 143* 96  --  89 96  BUN 23 21 17   --  12 16  CREATININE 3.36* 3.03* 2.58*  --  2.15* 2.03*  CALCIUM  6.2* 6.5* 6.1*  --  6.5* 6.3*  MG 1.1* 2.3 1.8  --  2.0 1.7  PHOS  --  5.0*  --   --   --   --    GFR: Estimated Creatinine Clearance: 18.7 mL/min (A) (by C-G formula based on SCr of 2.03 mg/dL (H)). Liver Function Tests: Recent Labs  Lab 12/19/18 2043  AST 13*  ALT 9  ALKPHOS 62  BILITOT 0.4  PROT 6.5  ALBUMIN 2.5*   No results for input(s): LIPASE, AMYLASE in the last 168 hours. No results for input(s): AMMONIA in the last 168 hours. Coagulation Profile: No results for input(s): INR, PROTIME in the last 168 hours. Cardiac Enzymes: No results for input(s): CKTOTAL, CKMB, CKMBINDEX, TROPONINI in the last 168 hours. BNP (last 3 results) No results for input(s): PROBNP in the last 8760 hours. HbA1C: No results for input(s): HGBA1C in the last 72 hours. CBG:  Recent Labs  Lab 12/22/18 1701 12/22/18 1834 12/22/18 2113 12/23/18 0003 12/23/18 0446  GLUCAP 98 115* 103* 95 78   Lipid Profile: No results for input(s): CHOL, HDL, LDLCALC, TRIG, CHOLHDL, LDLDIRECT in the last 72 hours. Thyroid Function Tests: No results for input(s): TSH, T4TOTAL, FREET4, T3FREE, THYROIDAB in the last 72 hours. Anemia Panel: No results for input(s): VITAMINB12, FOLATE, FERRITIN, TIBC, IRON, RETICCTPCT in the last 72 hours. Sepsis Labs: No results for input(s): PROCALCITON, LATICACIDVEN in the last 168 hours.  Recent Results (from the past 240 hour(s))  SARS Coronavirus 2 [facility] order, Performed in [facility] hospital lab) Nasopharyngeal Nasopharyngeal Swab     Status: Abnormal   Collection Time: 12/19/18 11:48 PM   Specimen: Nasopharyngeal Swab  Result Value Ref Range Status   SARS Coronavirus 2 POSITIVE (A) NEGATIVE Final    Comment: RESULT CALLED TO, READ BACK BY AND VERIFIED WITH: P JOHNSTON RN 12/20/18 0110 JDW (NOTE) If result is NEGATIVE SARS-CoV-2 target nucleic acids are NOT DETECTED. The SARS-CoV-2 RNA is generally detectable in upper and lower  respiratory specimens during  the acute phase of infection. The lowest  concentration of SARS-CoV-2 viral copies this assay can detect is 250  copies / mL. A negative result does not preclude SARS-CoV-2 infection  and should not be used as the sole basis for treatment or other  patient management decisions.  A negative result may occur with  improper specimen collection / handling, submission of specimen other  than nasopharyngeal swab, presence of viral mutation(s) within the  areas targeted by this assay, and inadequate number of viral copies  (<250 copies / mL). A negative result must be combined with clinical  observations, patient history, and epidemiological information. If result is POSITIVE SARS-CoV-2 target nucleic acids are DETECTED. The SA RS-CoV-2 RNA is generally detectable in upper and lower  respiratory specimens during the acute phase of infection.  Positive  results are indicative of active infection with SARS-CoV-2.  Clinical  correlation with patient history and other diagnostic information is  necessary to determine patient infection status.  Positive results do  not rule out bacterial infection or co-infection with other viruses. If result is PRESUMPTIVE POSTIVE SARS-CoV-2 nucleic acids MAY BE PRESENT.   A presumptive positive result was obtained on the submitted specimen  and confirmed on repeat testing.  While 2019 novel coronavirus  (SARS-CoV-2) nucleic acids may be present in the submitted sample  additional confirmatory testing may be necessary for epidemiological  and / or clinical management purposes  to differentiate between  SARS-CoV-2 and other Sarbecovirus currently known to infect humans.  If clinically indicated additional testing with an alternate test  methodology [id number]) is advi sed. The SARS-CoV-2 RNA is generally  detectable in upper and lower respiratory specimens during the acute  phase of infection. The expected result is Negative. Fact Sheet for Patients:   StrictlyIdeas.no Fact Sheet for Healthcare Providers: BankingDealers.co.za This test is not yet approved or cleared by the [place] FDA and has been authorized for detection and/or diagnosis of SARS-CoV-2 by FDA under an Emergency Use Authorization (EUA).  This EUA will remain in effect (meaning this test can be used) for the duration of the COVID-19 declaration under Section 564(b)(1) of the Act, 21 U.S.C. section 360bbb-3(b)(1), unless the authorization is terminated or revoked sooner. Performed at [facility], [place] [address], [place], [place] [address]   MRSA PCR Screening     Status: Abnormal   Collection Time: 12/20/18  4:00 AM   Specimen: Nasopharyngeal  Result Value Ref Range Status   MRSA by PCR POSITIVE (A) NEGATIVE Final    Comment: CRITICAL RESULT CALLED TO, READ BACK BY AND VERIFIED WITH: RN, Bernarda Caffey [phone] @0552  THANEY Performed at [place] [address] [place], [place] [address]   C difficile quick scan w PCR reflex     Status: None   Collection Time: 12/20/18  5:49 AM   Specimen: STOOL  Result Value Ref Range Status   C Diff antigen NEGATIVE NEGATIVE Final   C Diff toxin NEGATIVE NEGATIVE Final   C Diff interpretation No C. difficile detected.  Final    Comment: Performed at [facility], [place] [address], [place], [place] [address]  Gastrointestinal Panel by PCR , Stool     Status: None   Collection Time: 12/20/18  5:49 AM   Specimen: STOOL  Result Value Ref Range Status   Campylobacter species NOT DETECTED NOT DETECTED Final   Plesimonas shigelloides NOT DETECTED NOT DETECTED Final   Salmonella species NOT DETECTED NOT DETECTED Final   Yersinia enterocolitica NOT DETECTED NOT DETECTED Final   Vibrio species NOT DETECTED NOT DETECTED Final   Vibrio cholerae NOT DETECTED NOT DETECTED Final   Enteroaggregative E coli (EAEC) NOT DETECTED NOT DETECTED Final   Enteropathogenic E coli  (EPEC) NOT DETECTED NOT DETECTED Final   Enterotoxigenic E coli (ETEC) NOT DETECTED NOT DETECTED Final   Shiga like toxin producing E coli (STEC) NOT DETECTED NOT DETECTED Final   Shigella/Enteroinvasive E coli (EIEC) NOT DETECTED NOT DETECTED Final   Cryptosporidium NOT DETECTED NOT DETECTED Final   Cyclospora cayetanensis NOT DETECTED NOT DETECTED Final   Entamoeba histolytica NOT DETECTED NOT DETECTED Final   Giardia lamblia NOT DETECTED NOT DETECTED Final   Adenovirus F40/41 NOT DETECTED NOT DETECTED Final   Astrovirus NOT DETECTED NOT DETECTED Final   Norovirus GI/GII NOT DETECTED NOT DETECTED Final   Rotavirus A NOT DETECTED NOT DETECTED Final   Sapovirus (I, II, IV, and V) NOT DETECTED NOT DETECTED Final    Comment: Performed at [facility], [address], [place], [place] [address]      Radiology Studies: No results found.    Scheduled Meds: . sodium chloride   Intravenous Once  . Chlorhexidine Gluconate Cloth  6 each Topical Q0600  . Chlorhexidine Gluconate Cloth  6 each Topical Q0600  . fluticasone  1 spray Each Nare Daily  . lactose free nutrition  237 mL Oral TID WC  . mouth rinse  15 mL Mouth Rinse BID  . multivitamin  15 mL Oral Daily  . mupirocin ointment  1 application Nasal BID  . Teduglutide (rDNA)  1.2 mg Subcutaneous Daily  . umeclidinium bromide  1 puff Inhalation Daily   Continuous Infusions: . sodium chloride Stopped (12/21/18 1228)     LOS: 2 days      Time spent: 35 minutes   Dessa Phi, DO [facility] [email or web address] 12/23/2018, 11:35 AM

## 2018-12-24 DIAGNOSIS — E872 Acidosis: Secondary | ICD-10-CM

## 2018-12-24 LAB — CBC
HCT: 28.7 % — ABNORMAL LOW (ref 39.0–52.0)
Hemoglobin: 8.9 g/dL — ABNORMAL LOW (ref 13.0–17.0)
MCH: 28.8 pg (ref 26.0–34.0)
MCHC: 31 g/dL (ref 30.0–36.0)
MCV: 92.9 fL (ref 80.0–100.0)
Platelets: 324 10*3/uL (ref 150–400)
RBC: 3.09 MIL/uL — ABNORMAL LOW (ref 4.22–5.81)
RDW: 17.1 % — ABNORMAL HIGH (ref 11.5–15.5)
WBC: 8.3 10*3/uL (ref 4.0–10.5)
nRBC: 0 % (ref 0.0–0.2)

## 2018-12-24 LAB — PATHOLOGIST SMEAR REVIEW

## 2018-12-24 LAB — BASIC METABOLIC PANEL
Anion gap: 9 (ref 5–15)
BUN: 23 mg/dL (ref 8–23)
CO2: 19 mmol/L — ABNORMAL LOW (ref 22–32)
Calcium: 6.2 mg/dL — CL (ref 8.9–10.3)
Chloride: 110 mmol/L (ref 98–111)
Creatinine, Ser: 2.37 mg/dL — ABNORMAL HIGH (ref 0.61–1.24)
GFR calc Af Amer: 29 mL/min — ABNORMAL LOW (ref >60)
GFR calc non Af Amer: 25 mL/min — ABNORMAL LOW (ref >60)
Glucose, Bld: 85 mg/dL (ref 70–99)
Potassium: 4.6 mmol/L (ref 3.5–5.1)
Sodium: 138 mmol/L (ref 135–145)

## 2018-12-24 LAB — TYPE AND SCREEN
ABO/RH(D): O POS
Antibody Screen: NEGATIVE
Unit division: 0

## 2018-12-24 LAB — GLUCOSE, CAPILLARY
Glucose-Capillary: 102 mg/dL — ABNORMAL HIGH (ref 70–99)
Glucose-Capillary: 104 mg/dL — ABNORMAL HIGH (ref 70–99)
Glucose-Capillary: 125 mg/dL — ABNORMAL HIGH (ref 70–99)
Glucose-Capillary: 70 mg/dL (ref 70–99)
Glucose-Capillary: 79 mg/dL (ref 70–99)
Glucose-Capillary: 80 mg/dL (ref 70–99)
Glucose-Capillary: 98 mg/dL (ref 70–99)

## 2018-12-24 LAB — BPAM RBC
Blood Product Expiration Date: 202009142359
ISSUE DATE / TIME: 202008181524
Unit Type and Rh: 5100

## 2018-12-24 LAB — FOLATE RBC
Folate, Hemolysate: 429 ng/mL
Folate, RBC: 1826 ng/mL (ref >498)
Hematocrit: 23.5 % — ABNORMAL LOW (ref 37.5–51.0)

## 2018-12-24 LAB — HAPTOGLOBIN: Haptoglobin: 379 mg/dL — ABNORMAL HIGH (ref 34–355)

## 2018-12-24 MED ORDER — TRAZODONE HCL 50 MG PO TABS
25.0000 mg | ORAL_TABLET | Freq: Once | ORAL | Status: AC
  Administered 2018-12-24: 25 mg via ORAL
  Filled 2018-12-24: qty 1

## 2018-12-24 MED ORDER — CALCIUM CARBONATE ANTACID 500 MG PO CHEW
1.0000 | CHEWABLE_TABLET | Freq: Three times a day (TID) | ORAL | Status: DC
  Administered 2018-12-24 – 2018-12-25 (×3): 200 mg via ORAL
  Filled 2018-12-24 (×3): qty 1

## 2018-12-24 NOTE — Evaluation (Signed)
Occupational Therapy Evaluation Patient Details Name: Timothy Burns MRN: [id number] DOB: 23-Oct-1940 Today's Date: 12/24/2018    History of Present Illness Timothy Burns is a 78 y.o. male with medical history significant of dementia, COPD, CKD stage IV, depression, history of colon cancer status post hemicolectomy and current ileostomy, short gut syndrome, admitted for COVID pneumonitis last month, on 2 L supplemental oxygen at home chronically presenting to the hospital for evaluation of low hemoglobin/abnormal labs.   Clinical Impression   PTA, pt was living with his wife and was performing BADLs. Pt currently performing ADLs and functional mobility at [facility] A level. SpO2 maintained >97% on RA throughout activity; notified RN. Pt tolerating mobility to bathroom, sitting on toilet, and then emptied out his ostomy bag. Pt would benefit from further acute OT to facilitate safe dc. Recommend dc to home with HHOT for further OT to optimize safety, independence with ADLs, and return to PLOF.      Follow Up Recommendations  Home health OT;Supervision/Assistance - 24 hour    Equipment Recommendations  3 in 1 bedside commode    Recommendations for Other Services PT consult     Precautions / Restrictions Precautions Precautions: Fall Precaution Comments: reports falls x 2 little more than 6 months ago, on home O2 Restrictions Weight Bearing Restrictions: No      Mobility Bed Mobility Overal bed mobility: Modified Independent             General bed mobility comments: OOB in recliner on arrival  Transfers Overall transfer level: Needs assistance Equipment used: None Transfers: Sit to/from Stand Sit to Stand: Min guard         General transfer comment: Min Guard A for safety    Balance Overall balance assessment: Mild deficits observed, not formally tested                                         ADL either performed or assessed with clinical  judgement   ADL Overall ADL's : Needs assistance/impaired Eating/Feeding: Independent;Sitting   Grooming: Min guard;Standing;Wash/dry hands   Upper Body Bathing: Supervision/ safety;Set up;Sitting   Lower Body Bathing: Min guard;Sit to/from stand   Upper Body Dressing : Supervision/safety;Set up;Sitting   Lower Body Dressing: Min guard;Sit to/from stand   Toilet Transfer: Min guard;Ambulation;Regular Toilet;Grab bars     Toileting - Clothing Manipulation Details (indicate cue type and reason): Pt managing and emptying his ostomy bag while seated on toilet     Functional mobility during ADLs: Min guard;+2 for safety/equipment General ADL Comments: Pt performing at [facility] A level. HR 70-80s. SpO2 100-98% on RA. Notified RN     Vision Patient Visual Report: Other (comment)(Cataract sx)       Perception     Praxis      Pertinent Vitals/Pain Pain Assessment: Faces Faces Pain Scale: Hurts little more Pain Location: legs (restless) Pain Descriptors / Indicators: Aching Pain Intervention(s): Monitored during session;Limited activity within patient's tolerance;Repositioned     Hand Dominance     Extremity/Trunk Assessment Upper Extremity Assessment Upper Extremity Assessment: Generalized weakness   Lower Extremity Assessment Lower Extremity Assessment: Defer to PT evaluation LLE Deficits / Details: limited ankle DF wtih strength grossly 3-/5, otherwise [facility]   Cervical / Trunk Assessment Cervical / Trunk Assessment: Normal(ostomy bag (has had for ~30 years))   Communication Communication Communication: HOH   Cognition Arousal/Alertness:  Awake/alert Behavior During Therapy: WFL for tasks assessed/performed Overall Cognitive Status: Within Functional Limits for tasks assessed                                 General Comments: Baseline dementia. Follows commands and very agreeable to therapy   General Comments  SpO2 >97% on RA. HR 70s-80s.      Exercises     Shoulder Instructions      Home Living Family/patient expects to be discharged to:: Private residence Living Arrangements: Spouse/significant other Available Help at Discharge: Family;Available 24 hours/day Type of Home: House Home Access: Stairs to enter [employer] of Steps: 2 Entrance Stairs-Rails: Right Home Layout: One level     Bathroom Shower/Tub: Teacher, early years/pre: Standard     Home Equipment: Environmental consultant - 2 wheels;Grab bars - tub/shower          Prior Functioning/Environment Level of Independence: Independent                 OT Problem List: Decreased strength;Decreased range of motion;Decreased activity tolerance;Impaired balance (sitting and/or standing);Decreased knowledge of use of DME or AE;Decreased knowledge of precautions      OT Treatment/Interventions: Self-care/ADL training;Therapeutic exercise;Energy conservation;DME and/or AE instruction;Therapeutic activities;Patient/family education    OT Goals(Current goals can be found in the care plan section) Acute Rehab OT Goals Patient Stated Goal: eager for home asap OT Goal Formulation: With patient Time For Goal Achievement: 12/09/18 Potential to Achieve Goals: Good  OT Frequency: Min 2X/week   Barriers to D/C:            Co-evaluation              AM-PAC OT "6 Clicks" Daily Activity     Outcome Measure Help from another person eating meals?: None Help from another person taking care of personal grooming?: A Little Help from another person toileting, which includes using toliet, bedpan, or urinal?: A Little Help from another person bathing (including washing, rinsing, drying)?: A Little Help from another person to put on and taking off regular upper body clothing?: None Help from another person to put on and taking off regular lower body clothing?: A Little 6 Click Score: 20   End of Session Nurse Communication: Mobility status  Activity  Tolerance: Patient tolerated treatment well Patient left: with call bell/phone within reach;in bed;with bed alarm set  OT Visit Diagnosis: Unsteadiness on feet (R26.81);Other abnormalities of gait and mobility (R26.89);Muscle weakness (generalized) (M62.81)                Time: 2119-4174 OT Time Calculation (min): 33 min Charges:  OT General Charges $OT Visit: 1 Visit OT Evaluation $OT Eval Low Complexity: 1 Low OT Treatments $Self Care/Home Management : 8-22 mins  Orlena Garmon MSOT, OTR/L Acute Rehab Pager: [phone] Office: [place] 12/24/2018, 3:05 PM

## 2018-12-24 NOTE — Progress Notes (Signed)
PROGRESS NOTE    Timothy Burns  VHQ:[id number] DOB: 01-30-1941 DOA: 12/19/2018 PCP: Myrlene Broker, MD   Brief Narrative: Timothy Burns is a 78 y.o. malewith medical history significant ofdementia, COPD, CKD stage IV, depression, history of colon cancer status post hemicolectomy and current ileostomy, short gut syndrome, admitted for COVID pneumonitis last month, on 2 L supplemental oxygenat home chronicallypresenting to the hospital for evaluation of low hemoglobin/abnormal labs. Outpatient labs done by PCP showing Hgb 7.Daughter at bedside states patient has had a lot of stool output through his ileostomy for the past 1 week. They are changing the bag multiple times daily. They have also noted a lot of blood in the ileostomy bag a few times this week. Patient has not had any fevers. He denies any abdominal pain, nausea, or vomiting. He has chronic shortness of breath for which he uses 2 L home oxygen intermittently during the day and always at night. No cough or chest pain. He is followed by outpatient nephrology.   Assessment & Plan:   Principal Problem:   Hypokalemia Active Problems:   Hypomagnesemia   Hypocalcemia   QT prolongation   Metabolic acidosis   Hypokalemia Hypomagnesemia Hypocalcemia Thought to be secondary to increased stool output in setting of possible viral illness vs in setting of underlying short guy syndrome. Patient has been receiving replacement. -Tums -Check 1,25 and 25 vitamin d levels  Diarrhea Infectious vs secondary to short guy syndrome. Improving per patient. -Imodium prn  Acute on chronic anemia In setting of diarrhea. Previous report of hematochezia. FOBT negative. Patient has required 1 unit of PRBC. Slightly elevated LDH with normal bilirubin. Hemoglobin currently stable. Baseline hemoglobin appears to be between 8-9. -Repeat CBC in AM  Severe non-anion gap metabolic acidosis Improved with fluids.  AKI on CKD stage IV  Improved with IV fluids. Creatinine stable.  Dementia Stable.  Hypoglycemia Resolved.  COVID-19 positive No significant symptoms.  Chronic respiratory failure with hypoxia -Continue 2L via [place]   DVT prophylaxis: SCDs Code Status:   Code Status: Full Code Family Communication: None Disposition Plan: Discharge in 1-2 days if diarrhea and electrolytes improved   Consultants:   None  Procedures:   None  Antimicrobials:  None    Subjective: No issues today. No chest pain or dyspnea.  Objective: Vitals:   12/24/18 1050 12/24/18 1100 12/24/18 1108 12/24/18 1109  BP:    138/72  Pulse:      Resp: (!) 27 17 19 19   Temp:    98.2 F (36.8 C)  TempSrc:    Oral  SpO2:    96%  Weight:      Height:        Intake/Output Summary (Last 24 hours) at 12/24/2018 1502 Last data filed at 12/24/2018 1200 Gross per 24 hour  Intake 983.33 ml  Output 1950 ml  Net -966.67 ml   Filed Weights   12/20/18 0356  Weight: 44 kg    Examination:  General exam: Appears calm and comfortable Respiratory system: Clear to auscultation. Respiratory effort normal. Cardiovascular system: S1 & S2 heard, RRR. No murmurs, rubs, gallops or clicks. Gastrointestinal system: Abdomen is nondistended, soft and nontender. No organomegaly or masses felt. Normal bowel sounds heard. Central nervous system: Alert. No focal neurological deficits. Extremities: No edema. No calf tenderness Skin: No cyanosis. No rashes Psychiatry: Judgement and insight appear normal. Mood & affect appropriate.     Data Reviewed: I have personally reviewed following labs and imaging studies  CBC: Recent Labs  Lab 12/19/18 2043 12/21/18 0456 12/22/18 0825 12/23/18 0505 12/23/18 1907 12/24/18 0553  WBC 9.8 8.7 9.6 8.0  --  8.3  HGB 8.4* 7.9* 8.0* 6.9* 8.8* 8.9*  HCT 27.7* 24.1* 25.8* 23.4* 28.3* 28.7*  MCV 92.3 86.7 92.5 96.3  --  92.9  PLT 456* 402* 415* 303  --  854   Basic Metabolic Panel: Recent Labs   Lab 12/19/18 2043 12/20/18 0950 12/21/18 0456 12/21/18 1244 12/22/18 0825 12/23/18 0505 12/24/18 0553  NA 141 140 138  --  142 139 138  K 2.6* 3.0* 2.6* 3.0* 5.0 4.4 4.6  CL 116* 113* 100  --  111 111 110  CO2 10* 14* 25  --  23 19* 19*  GLUCOSE 90 143* 96  --  89 96 85  BUN 23 21 17   --  12 16 23   CREATININE 3.36* 3.03* 2.58*  --  2.15* 2.03* 2.37*  CALCIUM 6.2* 6.5* 6.1*  --  6.5* 6.3* 6.2*  MG 1.1* 2.3 1.8  --  2.0 1.7  --   PHOS  --  5.0*  --   --   --   --   --    GFR: Estimated Creatinine Clearance: 16 mL/min (A) (by C-G formula based on SCr of 2.37 mg/dL (H)). Liver Function Tests: Recent Labs  Lab 12/19/18 2043 12/23/18 1216  AST 13* 14*  ALT 9 9  ALKPHOS 62 62  BILITOT 0.4 0.7  PROT 6.5 5.6*  ALBUMIN 2.5* 2.0*   No results for input(s): LIPASE, AMYLASE in the last 168 hours. No results for input(s): AMMONIA in the last 168 hours. Coagulation Profile: No results for input(s): INR, PROTIME in the last 168 hours. Cardiac Enzymes: No results for input(s): CKTOTAL, CKMB, CKMBINDEX, TROPONINI in the last 168 hours. BNP (last 3 results) No results for input(s): PROBNP in the last 8760 hours. HbA1C: No results for input(s): HGBA1C in the last 72 hours. CBG: Recent Labs  Lab 12/23/18 2043 12/23/18 2337 12/24/18 0400 12/24/18 0829 12/24/18 1146  GLUCAP 109* 102* 70 80 98   Lipid Profile: No results for input(s): CHOL, HDL, LDLCALC, TRIG, CHOLHDL, LDLDIRECT in the last 72 hours. Thyroid Function Tests: No results for input(s): TSH, T4TOTAL, FREET4, T3FREE, THYROIDAB in the last 72 hours. Anemia Panel: Recent Labs    12/23/18 1216  VITAMINB12 233  FERRITIN 80  TIBC 195*  IRON 16*  RETICCTPCT 2.4   Sepsis Labs: No results for input(s): PROCALCITON, LATICACIDVEN in the last 168 hours.  Recent Results (from the past 240 hour(s))  SARS Coronavirus 2 [facility] order, Performed in [facility] hospital lab) Nasopharyngeal Nasopharyngeal Swab     Status:  Abnormal   Collection Time: 12/19/18 11:48 PM   Specimen: Nasopharyngeal Swab  Result Value Ref Range Status   SARS Coronavirus 2 POSITIVE (A) NEGATIVE Final    Comment: RESULT CALLED TO, READ BACK BY AND VERIFIED WITH: P JOHNSTON RN 12/20/18 0110 JDW (NOTE) If result is NEGATIVE SARS-CoV-2 target nucleic acids are NOT DETECTED. The SARS-CoV-2 RNA is generally detectable in upper and lower  respiratory specimens during the acute phase of infection. The lowest  concentration of SARS-CoV-2 viral copies this assay can detect is 250  copies / mL. A negative result does not preclude SARS-CoV-2 infection  and should not be used as the sole basis for treatment or other  patient management decisions.  A negative result may occur with  improper specimen collection / handling, submission of  specimen other  than nasopharyngeal swab, presence of viral mutation(s) within the  areas targeted by this assay, and inadequate number of viral copies  (<250 copies / mL). A negative result must be combined with clinical  observations, patient history, and epidemiological information. If result is POSITIVE SARS-CoV-2 target nucleic acids are DETECTED. The SA RS-CoV-2 RNA is generally detectable in upper and lower  respiratory specimens during the acute phase of infection.  Positive  results are indicative of active infection with SARS-CoV-2.  Clinical  correlation with patient history and other diagnostic information is  necessary to determine patient infection status.  Positive results do  not rule out bacterial infection or co-infection with other viruses. If result is PRESUMPTIVE POSTIVE SARS-CoV-2 nucleic acids MAY BE PRESENT.   A presumptive positive result was obtained on the submitted specimen  and confirmed on repeat testing.  While 2019 novel coronavirus  (SARS-CoV-2) nucleic acids may be present in the submitted sample  additional confirmatory testing may be necessary for epidemiological  and /  or clinical management purposes  to differentiate between  SARS-CoV-2 and other Sarbecovirus currently known to infect humans.  If clinically indicated additional testing with an alternate test  methodology [phone]) is advi sed. The SARS-CoV-2 RNA is generally  detectable in upper and lower respiratory specimens during the acute  phase of infection. The expected result is Negative. Fact Sheet for Patients:  StrictlyIdeas.no Fact Sheet for Healthcare Providers: BankingDealers.co.za This test is not yet approved or cleared by the [place] FDA and has been authorized for detection and/or diagnosis of SARS-CoV-2 by FDA under an Emergency Use Authorization (EUA).  This EUA will remain in effect (meaning this test can be used) for the duration of the COVID-19 declaration under Section 564(b)(1) of the Act, 21 U.S.C. section 360bbb-3(b)(1), unless the authorization is terminated or revoked sooner. Performed at [facility], [place] [address] [place], [place] [address]   MRSA PCR Screening     Status: Abnormal   Collection Time: 12/20/18  4:00 AM   Specimen: Nasopharyngeal  Result Value Ref Range Status   MRSA by PCR POSITIVE (A) NEGATIVE Final    Comment: CRITICAL RESULT CALLED TO, READ BACK BY AND VERIFIED WITH: RN, Bernarda Caffey [phone] @0552  THANEY Performed at [place] [address], [place], [place] [address]   C difficile quick scan w PCR reflex     Status: None   Collection Time: 12/20/18  5:49 AM   Specimen: STOOL  Result Value Ref Range Status   C Diff antigen NEGATIVE NEGATIVE Final   C Diff toxin NEGATIVE NEGATIVE Final   C Diff interpretation No C. difficile detected.  Final    Comment: Performed at [facility], [place] [address] [place], [place] [address]  Gastrointestinal Panel by PCR , Stool     Status: None   Collection Time: 12/20/18  5:49 AM   Specimen: STOOL  Result Value Ref Range Status    Campylobacter species NOT DETECTED NOT DETECTED Final   Plesimonas shigelloides NOT DETECTED NOT DETECTED Final   Salmonella species NOT DETECTED NOT DETECTED Final   Yersinia enterocolitica NOT DETECTED NOT DETECTED Final   Vibrio species NOT DETECTED NOT DETECTED Final   Vibrio cholerae NOT DETECTED NOT DETECTED Final   Enteroaggregative E coli (EAEC) NOT DETECTED NOT DETECTED Final   Enteropathogenic E coli (EPEC) NOT DETECTED NOT DETECTED Final   Enterotoxigenic E coli (ETEC) NOT DETECTED NOT DETECTED Final   Shiga  like toxin producing E coli (STEC) NOT DETECTED NOT DETECTED Final   Shigella/Enteroinvasive E coli (EIEC) NOT DETECTED NOT DETECTED Final   Cryptosporidium NOT DETECTED NOT DETECTED Final   Cyclospora cayetanensis NOT DETECTED NOT DETECTED Final   Entamoeba histolytica NOT DETECTED NOT DETECTED Final   Giardia lamblia NOT DETECTED NOT DETECTED Final   Adenovirus F40/41 NOT DETECTED NOT DETECTED Final   Astrovirus NOT DETECTED NOT DETECTED Final   Norovirus GI/GII NOT DETECTED NOT DETECTED Final   Rotavirus A NOT DETECTED NOT DETECTED Final   Sapovirus (I, II, IV, and V) NOT DETECTED NOT DETECTED Final    Comment: Performed at [facility], [address], [place], [place] [address]         Radiology Studies: No results found.      Scheduled Meds: . Chlorhexidine Gluconate Cloth  6 each Topical Q0600  . Chlorhexidine Gluconate Cloth  6 each Topical Q0600  . fluticasone  1 spray Each Nare Daily  . lactose free nutrition  237 mL Oral TID WC  . mouth rinse  15 mL Mouth Rinse BID  . multivitamin  15 mL Oral Daily  . mupirocin ointment  1 application Nasal BID  . Teduglutide (rDNA)  1.2 mg Subcutaneous Daily  . umeclidinium bromide  1 puff Inhalation Daily   Continuous Infusions: . sodium chloride Stopped (12/21/18 1228)     LOS: 3 days     Cordelia Poche, MD [facility] 12/24/2018, 3:02 PM  If 7PM-7AM, please contact night-coverage  [email or web address]

## 2018-12-24 NOTE — Progress Notes (Signed)
Msg sent to Dr.Kirby for something mild to help pt sleep per pt request.

## 2018-12-25 ENCOUNTER — Ambulatory Visit: Payer: Medicare Other | Admitting: Gastroenterology

## 2018-12-25 LAB — BASIC METABOLIC PANEL
Anion gap: 12 (ref 5–15)
BUN: 28 mg/dL — ABNORMAL HIGH (ref 8–23)
CO2: 20 mmol/L — ABNORMAL LOW (ref 22–32)
Calcium: 6.6 mg/dL — ABNORMAL LOW (ref 8.9–10.3)
Chloride: 107 mmol/L (ref 98–111)
Creatinine, Ser: 2.13 mg/dL — ABNORMAL HIGH (ref 0.61–1.24)
GFR calc Af Amer: 33 mL/min — ABNORMAL LOW (ref >60)
GFR calc non Af Amer: 29 mL/min — ABNORMAL LOW (ref >60)
Glucose, Bld: 106 mg/dL — ABNORMAL HIGH (ref 70–99)
Potassium: 4.5 mmol/L (ref 3.5–5.1)
Sodium: 139 mmol/L (ref 135–145)

## 2018-12-25 LAB — CALCITRIOL (1,25 DI-OH VIT D): Vit D, 1,25-Dihydroxy: 18 pg/mL — ABNORMAL LOW (ref 19.9–79.3)

## 2018-12-25 LAB — GLUCOSE, CAPILLARY
Glucose-Capillary: 70 mg/dL (ref 70–99)
Glucose-Capillary: 62 mg/dL — ABNORMAL LOW (ref 70–99)
Glucose-Capillary: 87 mg/dL (ref 70–99)
Glucose-Capillary: 80 mg/dL (ref 70–99)

## 2018-12-25 LAB — PHOSPHORUS: Phosphorus: 4 mg/dL (ref 2.5–4.6)

## 2018-12-25 LAB — VITAMIN D 25 HYDROXY (VIT D DEFICIENCY, FRACTURES): Vit D, 25-Hydroxy: 46.2 ng/mL (ref 30.0–100.0)

## 2018-12-25 MED ORDER — CALCIUM CARBONATE ANTACID 500 MG PO CHEW: 1.0000 | CHEWABLE_TABLET | Freq: Two times a day (BID) | ORAL | 0 refills | Status: AC

## 2018-12-25 NOTE — Progress Notes (Addendum)
Physical Therapy Treatment Patient Details Name: Timothy Burns MRN: [id number] DOB: 1941/03/21 Today's Date: 12/25/2018    History of Present Illness Timothy Burns is a 78 y.o. male with medical history significant of dementia, COPD, CKD stage IV, depression, history of colon cancer status post hemicolectomy and current ileostomy, short gut syndrome, admitted for COVID pneumonitis last month, on 2 L supplemental oxygen at home chronically presenting to the hospital for evaluation of low hemoglobin/abnormal labs.    PT Comments    Pr progressing well with mobility. Ambulated in room 100 feet without AD min guard assist. Pt on RA with Spo2 96% during mobility. He performed LE exercises seated EOB. Current POC remains appropriate.   Follow Up Recommendations  Home health PT     Equipment Recommendations  None recommended by PT    Recommendations for Other Services       Precautions / Restrictions Precautions Precautions: Fall;Other (comment) Precaution Comments: reports falls x 2 little more than 6 months ago, on home O2    Mobility  Bed Mobility Overal bed mobility: Modified Independent                Transfers Overall transfer level: Needs assistance Equipment used: None Transfers: Sit to/from Stand Sit to Stand: Min guard         General transfer comment: Min Guard A for safety  Ambulation/Gait Ambulation/Gait assistance: Min guard Gait Distance (Feet): 100 Feet Assistive device: None Gait Pattern/deviations: Step-through pattern;Decreased stride length Gait velocity: mildly decreased   General Gait Details: min guard for safety. No LOB noted. Pt ambulated on RA with SpO2 96%.   Stairs             Wheelchair Mobility    Modified Rankin (Stroke Patients Only)       Balance Overall balance assessment: Mild deficits observed, not formally tested                                          Cognition Arousal/Alertness:  Awake/alert Behavior During Therapy: WFL for tasks assessed/performed Overall Cognitive Status: Within Functional Limits for tasks assessed                                 General Comments: baseline dementia. Cooperative. Follows commands.      Exercises General Exercises - Lower Extremity Ankle Circles/Pumps: AROM;Both;10 reps;Seated Long Arc Quad: AROM;Right;Left;10 reps;Seated Hip ABduction/ADduction: AROM;Both;10 reps;Seated    General Comments General comments (skin integrity, edema, etc.): SpO2 96% on RA during ambulation. HR 70s-80s      Pertinent Vitals/Pain Pain Assessment: No/denies pain    Home Living                      Prior Function            PT Goals (current goals can now be found in the care plan section) Acute Rehab PT Goals Patient Stated Goal: home PT Goal Formulation: With patient Time For Goal Achievement: 12/30/18 Potential to Achieve Goals: Good Progress towards PT goals: Progressing toward goals    Frequency    Min 3X/week      PT Plan Current plan remains appropriate    Co-evaluation              AM-PAC PT "6 Clicks" Mobility  Outcome Measure  Help needed turning from your back to your side while in a flat bed without using bedrails?: None Help needed moving from lying on your back to sitting on the side of a flat bed without using bedrails?: None Help needed moving to and from a bed to a chair (including a wheelchair)?: A Little Help needed standing up from a chair using your arms (e.g., wheelchair or bedside chair)?: None Help needed to walk in hospital room?: A Little Help needed climbing 3-5 steps with a railing? : A Little 6 Click Score: 21    End of Session   Activity Tolerance: Patient tolerated treatment well Patient left: in bed;with call bell/phone within reach Nurse Communication: Mobility status PT Visit Diagnosis: Unsteadiness on feet (R26.81);Muscle weakness (generalized) (M62.81)      Time: 1855-0158 PT Time Calculation (min) (ACUTE ONLY): 15 min  Charges:  $Gait Training: 8-22 mins                     Lorrin Goodell, PT  Office # [phone] Pager [phone]    Lorriane Shire 12/25/2018, 10:55 AM

## 2018-12-25 NOTE — TOC Transition Note (Addendum)
Transition of Care [facility]) - CM/SW Discharge Note   Patient Details  Name: Timothy Burns MRN: [id number] Date of Birth: [birth year]  [facility] of [facility] CM/SW Contact:  Timothy Labrador, RN Phone Number: 12/25/2018, 2:51 PM   Clinical Narrative:   PTA independent from home with ileostomy .Daughters help with ileostomy care if needed, pt has had it for 34 years.   Pt's daughter will transport pt home.  Pt is extremely HOH and deferred assessment to his daughter Timothy Burns.  Per Timothy Burns pt is already active with [facility] - resumption orders written - agency aware and will resume services. Agency aware that pt is COVID positive.   Daughter given choice for DME - chose [employer] - agency contacted and referral accepted.  Pt is on home oxygen - pt will discharge home on oxygen baseline needs, daughter will bring portable tank for transport home if deemed necessary - bedside nurse to assess as pt is PRN baseline.  Family will provide thermometer in the home.  No other CM needs identified - CM signing off    Final next level of care: [place] Barriers to Discharge: Barriers Resolved   Patient Goals and CMS Choice Patient states their goals for this hospitalization and ongoing recovery are:: Pt is extremely hard of hearing - pt deferred all questions to his daughter Timothy Burns CMS Medicare.gov Compare Post Acute Care list provided to:: Patient Represenative (must comment) Choice offered to / list presented to : Adult Children  Discharge Placement                       Discharge Plan and Services                DME Arranged: 3-N-1 DME Agency: [facility] Date DME Agency Contacted: 12/25/18 Time DME Agency Contacted: [phone] Representative spoke with at DME Agency: Timothy Burns: PT, OT [place] Agency: [place] Date [place]: 12/25/18 Time [place]: [place] Representative spoke with at [place]: [place]  ([place]) Interventions     Readmission Risk Interventions No flowsheet data found.

## 2018-12-25 NOTE — Discharge Instructions (Signed)
Timothy Burns,  You were in the hospital with diarrhea and electrolyte abnormalities. This has improved. Please continue calcium supplementation. Please follow-up with your primary care physician and a kidney doctor.

## 2018-12-25 NOTE — Progress Notes (Signed)
This is the 2nd morning in a row that pt's accu check at 0400 has been 70. No Boost on unit. Given 240 ml of Coke and 8 G.Crackers.

## 2018-12-25 NOTE — Discharge Summary (Signed)
Physician Discharge Summary  Timothy Burns UGQ:[id number] DOB: 1940-10-29 DOA: 12/19/2018  PCP: Myrlene Broker, MD  Admit date: 12/19/2018 Discharge date: 12/25/2018  Admitted From: Home Disposition: Home  Recommendations for Outpatient Follow-up:  1. Follow up with PCP in 1 week 2. Please obtain BMP/CBC in one week and recheck calcium 3. Please follow up on the following pending results: None  Home Health: PT, OT Equipment/Devices: 3 in 1  Discharge Condition: Stable CODE STATUS: Full code Diet recommendation: Regular   Brief/Interim Summary:  Admission HPI written by Shela Leff, MD   Chief Complaint: Low hemoglobin/ abnormal labs  HPI: Timothy Burns is a 78 y.o. male with medical history significant of dementia, COPD, CKD stage IV, depression, history of colon cancer status post hemicolectomy and current ileostomy, short gut syndrome, admitted for COVID pneumonitis last month, on 2 L supplemental oxygen at home chronically presenting to the hospital for evaluation of low hemoglobin/abnormal labs.  Outpatient labs done by PCP showing Hgb 7.  Daughter at bedside states patient has had a lot of stool output through his ileostomy for the past 1 week.  They are changing the bag multiple times daily.  They have also noted a lot of blood in the ileostomy bag a few times this week.  Patient has not had any fevers.  He denies any abdominal pain, nausea, or vomiting.  He has chronic shortness of breath for which he uses 2 L home oxygen intermittently during the day and always at night.  No cough or chest pain.  He is followed by outpatient nephrology.  ED Course: Vitals stable on arrival.  No leukocytosis.  Hemoglobin 8.4, no significant change since labs done 3 weeks ago.  Platelet count 456,000.  Potassium 2.6.  Magnesium 1.1.  Bicarb 10, was 14 on 7/21.  Anion gap 15.  Corrected calcium 7.4.  BUN 23, creatinine 3.3.  Creatinine was 2.3 three weeks ago.  LFTs normal.  FOBT  negative.  COVID-19 rapid test positive.  Chest x-ray showing emphysema and no acute cardiopulmonary process.  Found to be in A. fib on arrival to the ED, rate controlled.  Subsequently converted to sinus rhythm.  Patient received potassium supplementation, IV magnesium 2 g, and was started on bicarb infusion.   Hospital course:  Hypokalemia Hypomagnesemia Hypocalcemia Thought to be secondary to increased stool output in setting of possible viral illness vs in setting of underlying short guy syndrome. Patient has been receiving replacement. 25 vitamin D Burns normal with slightly low 1,25 vitamin D. Discharge with calcium supplementation and recommend recheck calcium as an outpatient.  Diarrhea Infectious vs secondary to short guy syndrome. Improving per patient. Continue imodium as needed.  Acute on chronic anemia In setting of diarrhea. Previous report of hematochezia. FOBT negative. Patient has required 1 unit of PRBC. Slightly elevated LDH with normal bilirubin. Hemoglobin currently stable. Baseline hemoglobin appears to be between 8-9. Stable. Outpatient follow-up.  Severe non-anion gap metabolic acidosis Improved with fluids.  AKI on CKD stage IV Unknown baseline, but appears to be around 2-2.3. Peak of 3.36. Improved with IV fluids. Creatinine stable.  Dementia Stable.  Hypoglycemia Resolved.  COVID-19 positive No significant symptoms.  Chronic respiratory failure with hypoxia Continue 2L via [place]  Discharge Diagnoses:  Principal Problem:   Hypokalemia Active Problems:   Hypomagnesemia   Hypocalcemia   QT prolongation   Metabolic acidosis    Discharge Instructions   Allergies as of 12/25/2018   No Known Allergies  Medication List    TAKE these medications   acetaminophen 325 MG tablet Commonly known as: TYLENOL Take 2 tablets (650 mg total) by mouth every 6 (six) hours as needed for mild pain or headache (fever >/= 101).   albuterol 108 (90  Base) MCG/ACT inhaler Commonly known as: VENTOLIN HFA Inhale 1-2 puffs into the lungs every 6 (six) hours as needed for wheezing or shortness of breath.   calcium carbonate 500 MG chewable tablet Commonly known as: TUMS - dosed in mg elemental calcium Chew 1 tablet (200 mg of elemental calcium total) by mouth 2 (two) times daily with a meal for 5 days.   donepezil 10 MG tablet Commonly known as: ARICEPT Take 10 mg by mouth at bedtime.   escitalopram 10 MG tablet Commonly known as: LEXAPRO Take 10 mg by mouth daily.   ferrous sulfate 325 (65 FE) MG tablet Take 325 mg by mouth daily.   fluticasone 50 MCG/ACT nasal spray Commonly known as: FLONASE Place 1 spray into both nostrils daily.   folic acid 824 MCG tablet Commonly known as: FOLVITE Take 800 mcg by mouth daily.   HYDROcodone-acetaminophen 5-325 MG tablet Commonly known as: NORCO/VICODIN Take 1 tablet by mouth every 6 (six) hours as needed for moderate pain.   loperamide 2 MG capsule Commonly known as: IMODIUM Take 4 mg by mouth daily as needed for diarrhea or loose stools.   methocarbamol 500 MG tablet Commonly known as: ROBAXIN Take 500 mg by mouth every 8 (eight) hours as needed for muscle spasms.   ondansetron 4 MG disintegrating tablet Commonly known as: ZOFRAN-ODT Take 1 tablet (4 mg total) by mouth every 6 (six) hours as needed. What changed: reasons to take this   OXYGEN Inhale 2 L into the lungs daily as needed (O2 Burns).   Prevalite 4 GM/DOSE powder Generic drug: cholestyramine light Take 4 g by mouth as needed (diarrhea).   promethazine 25 MG tablet Commonly known as: PHENERGAN Take 1 tablet (25 mg total) by mouth every 8 (eight) hours as needed. What changed: reasons to take this   Spiriva HandiHaler 18 MCG inhalation capsule Generic drug: tiotropium Place 18 mcg into inhaler and inhale daily as needed (shortness of breath).   Teduglutide (rDNA) 5 MG Kit Commonly known as: Gattex Inject  1.2 mg sq daily What changed:   how much to take  how to take this  when to take this   traZODone 50 MG tablet Commonly known as: DESYREL Take 50 mg by mouth at bedtime as needed for sleep.   Vitamin D (Ergocalciferol) 1.25 MG (50000 UT) Caps capsule Commonly known as: DRISDOL Take 50,000 Units by mouth once a week.      Follow-up Information    Myrlene Broker, MD. Schedule an appointment as soon as possible for a visit in 1 week(s).   Specialty: Family Medicine Why: Repeat lab work Contact information: [place] [address] [phone]          No Known Allergies  Consultations:  None   Procedures/Studies: Ct Abdomen Pelvis Wo Contrast  Result Date: 12/20/2018 CLINICAL DATA:  Low hemoglobin labs COPD, CKD stage IV, history of colon cancer status post hemicolectomy and current ileostomyDiarrhea EXAM: CT ABDOMEN AND PELVIS WITHOUT CONTRAST TECHNIQUE: Multidetector CT imaging of the abdomen and pelvis was performed following the standard protocol without IV contrast. COMPARISON:  CT 02/23/2018 FINDINGS: Lower chest: Bibasilar pleuroparenchymal thickening. No pneumonia or aspiration pneumonitis. Emphysema noted. Hepatobiliary:  No focal hepatic lesion on noncontrast exam. Gallbladder is distended 5 cm which is similar to comparison exam. There is a large gallstone within the lumen the gallbladder. No evidence of acute inflammation. No biliary duct dilatation evident. Pancreas: Pancreas is normal. No ductal dilatation. No pancreatic inflammation. Spleen: Normal spleen Adrenals/urinary tract: Adrenal glands normal. The bladder is distended. There is mild hydronephrosis and hydroureter on the RIGHT. Mild hydroureter on the LEFT. No obstructing lesion identified. Nonobstructing calculus in the RIGHT kidney. Stomach/Bowel: Stomach, duodenum and small-bowel normal. No evidence of bowel obstruction. There is a enteric colonic anastomosis in the RIGHT lower  quadrant. There is colostomy in the LEFT abdominal wall. No free air. There is a large diverticulum along the descending colon (image 20/3) without acute inflammation. Vascular/Lymphatic: Abdominal aorta is normal caliber with atherosclerotic calcification. There is no retroperitoneal or periportal lymphadenopathy. No pelvic lymphadenopathy. Reproductive: Prostate small Other: No free fluid. Musculoskeletal: No aggressive osseous lesion. IMPRESSION: 1. Bladder distension and mild bilateral hydroureter suggest bladder outlet obstruction. 2. Partial colectomy with LEFT lower quadrant ostomy. No evidence of bowel obstruction. Diverticulosis of LEFT colon without evidence diverticulitis. 3. Distended gallbladder without acute inflammation. Gallstone noted. Electronically Signed   By: Suzy Bouchard M.D.   On: 12/20/2018 12:10   Dg Chest Port 1 View  Result Date: 12/20/2018 CLINICAL DATA:  78 year old male with history of COVID-19 last month presenting with shortness of breath and low hemoglobin. EXAM: PORTABLE CHEST 1 VIEW COMPARISON:  Chest radiograph dated 11/25/2018 and 11/22/2018 FINDINGS: There is severe emphysema and biapical scarring. No consolidative changes. There is no pleural effusion or pneumothorax. The cardiac silhouette is within normal limits. Coronary vascular calcification and atherosclerotic calcification of the aorta. Osteopenia. No acute osseous pathology. Right pectoral Port-A-Cath with tip at the cavoatrial junction. IMPRESSION: 1. No acute cardiopulmonary process. 2. Emphysema. Electronically Signed   By: Anner Crete M.D.   On: 12/20/2018 00:16      Subjective: No issues overnight.  Discharge Exam: Vitals:   12/24/18 2353 12/25/18 0738  BP:  124/61  Pulse: 88 97  Resp:  12  Temp:  97.7 F (36.5 C)  SpO2: 95% 97%   Vitals:   12/24/18 2100 12/24/18 2300 12/24/18 2353 12/25/18 0738  BP:    124/61  Pulse:   88 97  Resp: (!) 25 (!) 22  12  Temp:    97.7 F (36.5 C)   TempSrc:    Axillary  SpO2:   95% 97%  Weight:      Height:        General: Pt is alert, awake, not in acute distress Cardiovascular: RRR, S1/S2 +, no rubs, no gallops Respiratory: CTA bilaterally, no wheezing, no rhonchi Abdominal: Soft, NT, ND, bowel sounds + Extremities: no edema, no cyanosis    The results of significant diagnostics from this hospitalization (including imaging, microbiology, ancillary and laboratory) are listed below for reference.     Microbiology: Recent Results (from the past 240 hour(s))  SARS Coronavirus 2 [facility] order, Performed in [facility] hospital lab) Nasopharyngeal Nasopharyngeal Swab     Status: Abnormal   Collection Time: 12/19/18 11:48 PM   Specimen: Nasopharyngeal Swab  Result Value Ref Range Status   SARS Coronavirus 2 POSITIVE (A) NEGATIVE Final    Comment: RESULT CALLED TO, READ BACK BY AND VERIFIED WITH: P JOHNSTON RN 12/20/18 0110 JDW (NOTE) If result is NEGATIVE SARS-CoV-2 target nucleic acids are NOT DETECTED. The SARS-CoV-2 RNA is generally detectable in upper and  lower  respiratory specimens during the acute phase of infection. The lowest  concentration of SARS-CoV-2 viral copies this assay can detect is 250  copies / mL. A negative result does not preclude SARS-CoV-2 infection  and should not be used as the sole basis for treatment or other  patient management decisions.  A negative result may occur with  improper specimen collection / handling, submission of specimen other  than nasopharyngeal swab, presence of viral mutation(s) within the  areas targeted by this assay, and inadequate number of viral copies  (<250 copies / mL). A negative result must be combined with clinical  observations, patient history, and epidemiological information. If result is POSITIVE SARS-CoV-2 target nucleic acids are DETECTED. The SA RS-CoV-2 RNA is generally detectable in upper and lower  respiratory specimens during the acute phase of  infection.  Positive  results are indicative of active infection with SARS-CoV-2.  Clinical  correlation with patient history and other diagnostic information is  necessary to determine patient infection status.  Positive results do  not rule out bacterial infection or co-infection with other viruses. If result is PRESUMPTIVE POSTIVE SARS-CoV-2 nucleic acids MAY BE PRESENT.   A presumptive positive result was obtained on the submitted specimen  and confirmed on repeat testing.  While 2019 novel coronavirus  (SARS-CoV-2) nucleic acids may be present in the submitted sample  additional confirmatory testing may be necessary for epidemiological  and / or clinical management purposes  to differentiate between  SARS-CoV-2 and other Sarbecovirus currently known to infect humans.  If clinically indicated additional testing with an alternate test  methodology [phone]) is advi sed. The SARS-CoV-2 RNA is generally  detectable in upper and lower respiratory specimens during the acute  phase of infection. The expected result is Negative. Fact Sheet for Patients:  StrictlyIdeas.no Fact Sheet for Healthcare Providers: BankingDealers.co.za This test is not yet approved or cleared by the [place] FDA and has been authorized for detection and/or diagnosis of SARS-CoV-2 by FDA under an Emergency Use Authorization (EUA).  This EUA will remain in effect (meaning this test can be used) for the duration of the COVID-19 declaration under Section 564(b)(1) of the Act, 21 U.S.C. section 360bbb-3(b)(1), unless the authorization is terminated or revoked sooner. Performed at [facility], [place] [address] [place], [place] [address]   MRSA PCR Screening     Status: Abnormal   Collection Time: 12/20/18  4:00 AM   Specimen: Nasopharyngeal  Result Value Ref Range Status   MRSA by PCR POSITIVE (A) NEGATIVE Final    Comment: CRITICAL RESULT CALLED TO, READ  BACK BY AND VERIFIED WITH: RNBernarda Caffey [id number] '@[id number]'  THANEY Performed at [place] [address] [place], [place] [address]   C difficile quick scan w PCR reflex     Status: None   Collection Time: 12/20/18  5:49 AM   Specimen: STOOL  Result Value Ref Range Status   C Diff antigen NEGATIVE NEGATIVE Final   C Diff toxin NEGATIVE NEGATIVE Final   C Diff interpretation No C. difficile detected.  Final    Comment: Performed at [facility], [place] [address] [place], [place] [address]  Gastrointestinal Panel by PCR , Stool     Status: None   Collection Time: 12/20/18  5:49 AM   Specimen: STOOL  Result Value Ref Range Status   Campylobacter species NOT DETECTED NOT DETECTED Final   Plesimonas shigelloides NOT DETECTED NOT DETECTED Final   Salmonella  species NOT DETECTED NOT DETECTED Final   Yersinia enterocolitica NOT DETECTED NOT DETECTED Final   Vibrio species NOT DETECTED NOT DETECTED Final   Vibrio cholerae NOT DETECTED NOT DETECTED Final   Enteroaggregative E coli (EAEC) NOT DETECTED NOT DETECTED Final   Enteropathogenic E coli (EPEC) NOT DETECTED NOT DETECTED Final   Enterotoxigenic E coli (ETEC) NOT DETECTED NOT DETECTED Final   Shiga like toxin producing E coli (STEC) NOT DETECTED NOT DETECTED Final   Shigella/Enteroinvasive E coli (EIEC) NOT DETECTED NOT DETECTED Final   Cryptosporidium NOT DETECTED NOT DETECTED Final   Cyclospora cayetanensis NOT DETECTED NOT DETECTED Final   Entamoeba histolytica NOT DETECTED NOT DETECTED Final   Giardia lamblia NOT DETECTED NOT DETECTED Final   Adenovirus F40/41 NOT DETECTED NOT DETECTED Final   Astrovirus NOT DETECTED NOT DETECTED Final   Norovirus GI/GII NOT DETECTED NOT DETECTED Final   Rotavirus A NOT DETECTED NOT DETECTED Final   Sapovirus (I, II, IV, and V) NOT DETECTED NOT DETECTED Final    Comment: Performed at [facility], [place] [place], [place] [address]     Labs: BNP (last 3  results) No results for input(s): BNP in the last 8760 hours. Basic Metabolic Panel: Recent Labs  Lab 12/19/18 2043 12/20/18 0950 12/21/18 0456 12/21/18 1244 12/22/18 0825 12/23/18 0505 12/24/18 0553 12/25/18 0446  NA 141 140 138  --  142 139 138 139  K 2.6* 3.0* 2.6* 3.0* 5.0 4.4 4.6 4.5  CL 116* 113* 100  --  111 111 110 107  CO2 10* 14* 25  --  23 19* 19* 20*  GLUCOSE 90 143* 96  --  89 96 85 106*  BUN '23 21 17  ' --  '12 16 23 ' 28*  CREATININE 3.36* 3.03* 2.58*  --  2.15* 2.03* 2.37* 2.13*  CALCIUM 6.2* 6.5* 6.1*  --  6.5* 6.3* 6.2* 6.6*  MG 1.1* 2.3 1.8  --  2.0 1.7  --   --   PHOS  --  5.0*  --   --   --   --   --  4.0   Liver Function Tests: Recent Labs  Lab 12/19/18 2043 12/23/18 1216  AST 13* 14*  ALT 9 9  ALKPHOS 62 62  BILITOT 0.4 0.7  PROT 6.5 5.6*  ALBUMIN 2.5* 2.0*   No results for input(s): LIPASE, AMYLASE in the last 168 hours. No results for input(s): AMMONIA in the last 168 hours. CBC: Recent Labs  Lab 12/19/18 2043 12/21/18 0456 12/22/18 0825 12/23/18 0505 12/23/18 1216 12/23/18 1907 12/24/18 0553  WBC 9.8 8.7 9.6 8.0  --   --  8.3  HGB 8.4* 7.9* 8.0* 6.9*  --  8.8* 8.9*  HCT 27.7* 24.1* 25.8* 23.4* 23.5* 28.3* 28.7*  MCV 92.3 86.7 92.5 96.3  --   --  92.9  PLT 456* 402* 415* 303  --   --  324   Cardiac Enzymes: No results for input(s): CKTOTAL, CKMB, CKMBINDEX, TROPONINI in the last 168 hours. BNP: Invalid input(s): POCBNP CBG: Recent Labs  Lab 12/24/18 2348 12/25/18 0404 12/25/18 0732 12/25/18 0806 12/25/18 1228  GLUCAP 104* 70 62* 87 80   D-Dimer No results for input(s): DDIMER in the last 72 hours. Hgb A1c No results for input(s): HGBA1C in the last 72 hours. Lipid Profile No results for input(s): CHOL, HDL, LDLCALC, TRIG, CHOLHDL, LDLDIRECT in the last 72 hours. Thyroid function studies No results for input(s): TSH, T4TOTAL, T3FREE, THYROIDAB in  the last 72 hours.  Invalid input(s): FREET3 Anemia work up Recent Labs     12/23/18 1216  VITAMINB12 233  FERRITIN 80  TIBC 195*  IRON 16*  RETICCTPCT 2.4   Urinalysis No results found for: COLORURINE, APPEARANCEUR, LABSPEC, [place], GLUCOSEU, [place], [place], KETONESUR, PROTEINUR, UROBILINOGEN, NITRITE, LEUKOCYTESUR Sepsis Labs Invalid input(s): PROCALCITONIN,  WBC,  [place] Microbiology Recent Results (from the past 240 hour(s))  SARS Coronavirus 2 [facility] order, Performed in [facility] hospital lab) Nasopharyngeal Nasopharyngeal Swab     Status: Abnormal   Collection Time: 12/19/18 11:48 PM   Specimen: Nasopharyngeal Swab  Result Value Ref Range Status   SARS Coronavirus 2 POSITIVE (A) NEGATIVE Final    Comment: RESULT CALLED TO, READ BACK BY AND VERIFIED WITH: P JOHNSTON RN 12/20/18 0110 JDW (NOTE) If result is NEGATIVE SARS-CoV-2 target nucleic acids are NOT DETECTED. The SARS-CoV-2 RNA is generally detectable in upper and lower  respiratory specimens during the acute phase of infection. The lowest  concentration of SARS-CoV-2 viral copies this assay can detect is 250  copies / mL. A negative result does not preclude SARS-CoV-2 infection  and should not be used as the sole basis for treatment or other  patient management decisions.  A negative result may occur with  improper specimen collection / handling, submission of specimen other  than nasopharyngeal swab, presence of viral mutation(s) within the  areas targeted by this assay, and inadequate number of viral copies  (<250 copies / mL). A negative result must be combined with clinical  observations, patient history, and epidemiological information. If result is POSITIVE SARS-CoV-2 target nucleic acids are DETECTED. The SA RS-CoV-2 RNA is generally detectable in upper and lower  respiratory specimens during the acute phase of infection.  Positive  results are indicative of active infection with SARS-CoV-2.  Clinical  correlation with patient history and other diagnostic information  is  necessary to determine patient infection status.  Positive results do  not rule out bacterial infection or co-infection with other viruses. If result is PRESUMPTIVE POSTIVE SARS-CoV-2 nucleic acids MAY BE PRESENT.   A presumptive positive result was obtained on the submitted specimen  and confirmed on repeat testing.  While 2019 novel coronavirus  (SARS-CoV-2) nucleic acids may be present in the submitted sample  additional confirmatory testing may be necessary for epidemiological  and / or clinical management purposes  to differentiate between  SARS-CoV-2 and other Sarbecovirus currently known to infect humans.  If clinically indicated additional testing with an alternate test  methodology [phone]) is advi sed. The SARS-CoV-2 RNA is generally  detectable in upper and lower respiratory specimens during the acute  phase of infection. The expected result is Negative. Fact Sheet for Patients:  StrictlyIdeas.no Fact Sheet for Healthcare Providers: BankingDealers.co.za This test is not yet approved or cleared by the [place] FDA and has been authorized for detection and/or diagnosis of SARS-CoV-2 by FDA under an Emergency Use Authorization (EUA).  This EUA will remain in effect (meaning this test can be used) for the duration of the COVID-19 declaration under Section 564(b)(1) of the Act, 21 U.S.C. section 360bbb-3(b)(1), unless the authorization is terminated or revoked sooner. Performed at [facility], [place] [address], [place], [place] [address]   MRSA PCR Screening     Status: Abnormal   Collection Time: 12/20/18  4:00 AM   Specimen: Nasopharyngeal  Result Value Ref Range Status   MRSA by PCR POSITIVE (A) NEGATIVE Final    Comment: CRITICAL  RESULT CALLED TO, READ BACK BY AND VERIFIED WITH: RNBernarda Caffey [id number] '@[id number]'  THANEY Performed at [facility] [address] [place], [place] [address]   C difficile  quick scan w PCR reflex     Status: None   Collection Time: 12/20/18  5:49 AM   Specimen: STOOL  Result Value Ref Range Status   C Diff antigen NEGATIVE NEGATIVE Final   C Diff toxin NEGATIVE NEGATIVE Final   C Diff interpretation No C. difficile detected.  Final    Comment: Performed at [facility], [place] [address], [place], [place] [address]  Gastrointestinal Panel by PCR , Stool     Status: None   Collection Time: 12/20/18  5:49 AM   Specimen: STOOL  Result Value Ref Range Status   Campylobacter species NOT DETECTED NOT DETECTED Final   Plesimonas shigelloides NOT DETECTED NOT DETECTED Final   Salmonella species NOT DETECTED NOT DETECTED Final   Yersinia enterocolitica NOT DETECTED NOT DETECTED Final   Vibrio species NOT DETECTED NOT DETECTED Final   Vibrio cholerae NOT DETECTED NOT DETECTED Final   Enteroaggregative E coli (EAEC) NOT DETECTED NOT DETECTED Final   Enteropathogenic E coli (EPEC) NOT DETECTED NOT DETECTED Final   Enterotoxigenic E coli (ETEC) NOT DETECTED NOT DETECTED Final   Shiga like toxin producing E coli (STEC) NOT DETECTED NOT DETECTED Final   Shigella/Enteroinvasive E coli (EIEC) NOT DETECTED NOT DETECTED Final   Cryptosporidium NOT DETECTED NOT DETECTED Final   Cyclospora cayetanensis NOT DETECTED NOT DETECTED Final   Entamoeba histolytica NOT DETECTED NOT DETECTED Final   Giardia lamblia NOT DETECTED NOT DETECTED Final   Adenovirus F40/41 NOT DETECTED NOT DETECTED Final   Astrovirus NOT DETECTED NOT DETECTED Final   Norovirus GI/GII NOT DETECTED NOT DETECTED Final   Rotavirus A NOT DETECTED NOT DETECTED Final   Sapovirus (I, II, IV, and V) NOT DETECTED NOT DETECTED Final    Comment: Performed at [facility], [address] [place], [place] [address]     Time coordinating discharge: 35 minutes  SIGNED:   Cordelia Poche, MD [facility] 12/25/2018, 2:10 PM

## 2018-12-25 NOTE — Progress Notes (Signed)
Pt's stool becoming too thick to pass through Ileostomy bag port. Pt stated he may need something to help thin it some. BM thicker this morning than last night.

## 2018-12-29 ENCOUNTER — Other Ambulatory Visit: Payer: Self-pay | Admitting: Gastroenterology

## 2018-12-31 DIAGNOSIS — E612 Magnesium deficiency: Secondary | ICD-10-CM | POA: Insufficient documentation

## 2019-01-06 ENCOUNTER — Encounter: Payer: Self-pay | Admitting: Gastroenterology

## 2019-01-06 ENCOUNTER — Ambulatory Visit: Payer: Medicare Other | Admitting: Gastroenterology

## 2019-01-06 ENCOUNTER — Other Ambulatory Visit: Payer: Self-pay

## 2019-01-06 VITALS — BP 126/56 | HR 73 | Temp 97.7°F | Ht 68.0 in | Wt 107.1 lb

## 2019-01-06 DIAGNOSIS — K912 Postsurgical malabsorption, not elsewhere classified: Secondary | ICD-10-CM | POA: Diagnosis not present

## 2019-01-06 MED ORDER — PROMETHAZINE HCL 25 MG PO TABS: 25.0000 mg | ORAL_TABLET | Freq: Three times a day (TID) | ORAL | 6 refills | Status: DC | PRN

## 2019-01-06 MED ORDER — ONDANSETRON 4 MG PO TBDP: 4.0000 mg | ORAL_TABLET | Freq: Four times a day (QID) | ORAL | 6 refills | Status: DC | PRN

## 2019-01-06 MED ORDER — CHOLESTYRAMINE 4 G PO PACK: PACK | ORAL | 3 refills | Status: AC

## 2019-01-06 NOTE — Progress Notes (Signed)
Chief Complaint:   Referring Provider:  Myrlene Broker, MD      ASSESSMENT AND PLAN;   #1. Short gut syndrome  #2.  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  Patient with short gut syndrome.  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  Plan: -Continue Gattex 5 mg subcu every day.  1 kit containing 30 injections.  Refills x12 (started 03/19/2017) -Continue cholestyramine 4 g p.o.QD (#30, 11 refills) -Continue Zofran ODT 4 mg 1 tablet every 6 hours as needed for nausea 30 tablets with 6 refills. -Continue promethazine 25 mg 1 tablet every 8 hours as needed 30 tablets with 6 refills.  Sedation precautions as well. -Patient's daughter to make appointment with nephrology.  May need an Aranesp. -FU in 6 months.  Earlier, if with any new problems.  HPI:    Timothy Burns is a 78 y.o. male  For follow-up visit from recent hospitalization due to COVID pneumonitis.  Initially was seen on July 10 - [facility], positive covid, transferred to [place], D/C July 17, again seen at [facility] and then transferred to [facility] on July 18.   Has recovered well.  Lost significant weight and weighed 94 pounds at discharge per patient's family.  Also was found to be significantly anemic with heme neg stools in setting of CKD.  Most recent hemoglobin on 12/31/2018 was 8.1.  No nausea, vomiting, heartburn, regurgitation, odynophagia or dysphagia.  No significant diarrhea or constipation.  There is no melena or hematochezia. No unintentional weight loss.  Great appetite.  Now weighs 107 pounds.  Had lot of diarrhea.  Better with cholestyamine   Doing very well from GI standpoint Has some nausea which is under good control on PRN Zofran and Phenergan.  He only takes medicine sparingly  Recently had a fall and had extensive ecchymosis on the left side of the head Then was found to have pneumonia and UTI-treated with antibiotics.  He has underlying COPD. Doing well currently except for weight loss Has not been eating well while he was on antibiotics.  The appetite is getting better. Tolerating gattex well - has been using cholestyramine only once or twice a month now.  Previously has been using it every day or sometimes twice a day.  Labs reviewed during this visit: Potassium 3.6, sodium 135, creatinine 2.88 normal liver function tests, magnesium 2.4 Past Medical History:  Diagnosis Date  . Acute-on-chronic kidney injury ([place])   . COPD (chronic obstructive pulmonary disease) ([place])   . Dementia ([place])   . Depression   . History of colon cancer   . Hypokalemia   . Short gut syndrome   . UTI (urinary tract infection)     Past Surgical History:  Procedure Laterality Date  . HEMICOLECTOMY    . INGUINAL HERNIA REPAIR    . PORTACATH PLACEMENT    . Resection of Small Bowel    . SHOULDER SURGERY    . Transuretheral Section of prostate      Family History  Problem Relation Age of Onset  . Transient ischemic attack Mother   . Esophageal cancer Neg Hx     Social History   Tobacco Use  . Smoking status: Former Smoker    Types: Cigarettes  . Smokeless tobacco: Never Used  Substance Use Topics  . Alcohol  use: Not Currently  . Drug use: Not Currently    Current Outpatient Medications  Medication Sig Dispense Refill  . acetaminophen (TYLENOL) 325 MG tablet Take 2 tablets (650 mg total) by mouth every 6 (six) hours as needed for mild pain or headache (fever >/= 101).    Marland Kitchen albuterol (PROVENTIL HFA;VENTOLIN HFA) 108 (90 Base) MCG/ACT inhaler Inhale 1-2 puffs into the lungs every 6 (six) hours as needed for wheezing or shortness of breath.     . cholestyramine (QUESTRAN) 4 g packet ADD PACKET WITH LIQUID AND DRINK BY MOUTH ONCE DAILY. 30 each 3  . cholestyramine light  (PREVALITE) 4 GM/DOSE powder Take 4 g by mouth as needed (diarrhea).     . donepezil (ARICEPT) 10 MG tablet Take 10 mg by mouth at bedtime.     Marland Kitchen escitalopram (LEXAPRO) 10 MG tablet Take 10 mg by mouth daily.     . ferrous sulfate 325 (65 FE) MG tablet Take 325 mg by mouth daily.     . fluticasone (FLONASE) 50 MCG/ACT nasal spray Place 1 spray into both nostrils daily.     . folic acid (FOLVITE) 329 MCG tablet Take 800 mcg by mouth daily.     Marland Kitchen HYDROcodone-acetaminophen (NORCO/VICODIN) 5-325 MG tablet Take 1 tablet by mouth every 6 (six) hours as needed for moderate pain.     Marland Kitchen loperamide (IMODIUM) 2 MG capsule Take 4 mg by mouth daily as needed for diarrhea or loose stools.    . methocarbamol (ROBAXIN) 500 MG tablet Take 500 mg by mouth every 8 (eight) hours as needed for muscle spasms.     . ondansetron (ZOFRAN-ODT) 4 MG disintegrating tablet Take 1 tablet (4 mg total) by mouth every 6 (six) hours as needed. (Patient taking differently: Take 4 mg by mouth every 6 (six) hours as needed for nausea or vomiting. ) 30 tablet 6  . OXYGEN Inhale 2 L into the lungs daily as needed (O2 LEVELS).     . promethazine (PHENERGAN) 25 MG tablet Take 1 tablet (25 mg total) by mouth every 8 (eight) hours as needed. (Patient taking differently: Take 25 mg by mouth every 8 (eight) hours as needed for nausea or vomiting. ) 30 tablet 6  . SPIRIVA HANDIHALER 18 MCG inhalation capsule Place 18 mcg into inhaler and inhale daily as needed (shortness of breath).     . Teduglutide, rDNA, (GATTEX) 5 MG KIT Inject 1.2 mg sq daily (Patient taking differently: Inject 0.12 mLs into the skin daily. Inject 1.2 mg sq daily) 30 kit 11  . Vitamin D, Ergocalciferol, (DRISDOL) 50000 units CAPS capsule Take 50,000 Units by mouth once a week.     . traZODone (DESYREL) 50 MG tablet Take 50 mg by mouth at bedtime as needed for sleep.      No current facility-administered medications for this visit.     No Known Allergies  Review of  Systems:  Negative except for HPI     Physical Exam:    BP (!) 126/56   Pulse 73   Temp 97.7 F (36.5 C)   Ht _0  (1.727 m)   Wt 107 lb 2 oz (48.6 kg)   BMI 16.29 kg/m  Filed Weights   01/06/19 1607  Weight: 107 lb 2 oz (48.6 kg)   Constitutional:  Well-developed, in no acute distress. Has bruise/old hematoma left forehead Psychiatric: Normal mood and affect. Behavior is normal. HEENT: Pupils normal.  Conjunctivae are normal. No scleral icterus. Neck supple.  Cardiovascular: Normal rate, regular rhythm. No edema Pulmonary/chest: Effort normal and breath sounds normal. No wheezing, rales or rhonchi. Abdominal: Soft, nondistended. Nontender. Bowel sounds active throughout. There are no masses palpable. No hepatomegaly.  Has ileostomy. Rectal:  defered Neurological: Alert and oriented to person place and time. Skin: Skin is warm and dry. No rashes noted. 25 minutes spent with the patient today. Greater than 50% was spent in counseling and coordination of care with the patient    Carmell Austria, MD 01/06/2019, 4:19 PM  Cc: Myrlene Broker, MD

## 2019-01-06 NOTE — Patient Instructions (Signed)
If you are age 79 or older, your body mass index should be between 23-30. Your Body mass index is 16.29 kg/m. If this is out of the aforementioned range listed, please consider follow up with your Primary Care Provider.  If you are age 61 or younger, your body mass index should be between 19-25. Your Body mass index is 16.29 kg/m. If this is out of the aformentioned range listed, please consider follow up with your Primary Care Provider.   Continue taking Gattex, Cholestyramine, Zofran and Promethazine.   Follow up 6 months.   Thank you,  Dr. Jackquline Denmark

## 2019-02-06 DIAGNOSIS — F0390 Unspecified dementia without behavioral disturbance: Secondary | ICD-10-CM

## 2019-02-06 DIAGNOSIS — R531 Weakness: Secondary | ICD-10-CM

## 2019-02-06 DIAGNOSIS — N184 Chronic kidney disease, stage 4 (severe): Secondary | ICD-10-CM

## 2019-02-06 DIAGNOSIS — J449 Chronic obstructive pulmonary disease, unspecified: Secondary | ICD-10-CM

## 2019-02-06 DIAGNOSIS — N189 Chronic kidney disease, unspecified: Secondary | ICD-10-CM

## 2019-02-07 DIAGNOSIS — J449 Chronic obstructive pulmonary disease, unspecified: Secondary | ICD-10-CM | POA: Diagnosis not present

## 2019-02-07 DIAGNOSIS — R531 Weakness: Secondary | ICD-10-CM | POA: Diagnosis not present

## 2019-02-07 DIAGNOSIS — N184 Chronic kidney disease, stage 4 (severe): Secondary | ICD-10-CM | POA: Diagnosis not present

## 2019-04-21 ENCOUNTER — Telehealth: Payer: Self-pay | Admitting: Gastroenterology

## 2019-04-21 DIAGNOSIS — K912 Postsurgical malabsorption, not elsewhere classified: Secondary | ICD-10-CM

## 2019-04-21 NOTE — Telephone Encounter (Signed)
Pt's wife called to inform that pt's medication Gattex will need PA beginning next year. The new phone # for PA Josem Kaufmann is [phone].

## 2019-04-21 NOTE — Telephone Encounter (Signed)
Please assist with this.

## 2019-04-22 NOTE — Telephone Encounter (Signed)
Can a refill for Gattex be sent in for assurance of need for PA? Please advise

## 2019-04-22 NOTE — Telephone Encounter (Signed)
Should be okay to fill Gattex May have to get in touch with the rep Same dose  We may have to fill few forms as last time  Thx  RG

## 2019-04-23 MED ORDER — GATTEX 5 MG ~~LOC~~ KIT: 0.1200 mL | PACK | Freq: Every day | SUBCUTANEOUS | Status: DC

## 2019-04-23 NOTE — Telephone Encounter (Signed)
RX refill sent to pharmacy per MD recommendations

## 2019-04-24 MED ORDER — GATTEX 5 MG ~~LOC~~ KIT: 0.1200 mL | PACK | Freq: Every day | SUBCUTANEOUS | Status: AC

## 2019-04-24 NOTE — Telephone Encounter (Signed)
Patient's daughter returned call to office- Donna-verified DPR-RX needs to be sent to [facility] for refill-RX sent per refill order from Dr. Brunetta Jeans advised to call back to the office at [phone] should questions/concerns arise;  Butch Penny verbalized understanding of information/instructions;

## 2019-04-24 NOTE — Telephone Encounter (Signed)
Left message for patient's daughter to call back to the office;  

## 2019-05-28 ENCOUNTER — Other Ambulatory Visit: Payer: Self-pay

## 2019-05-28 ENCOUNTER — Ambulatory Visit (INDEPENDENT_AMBULATORY_CARE_PROVIDER_SITE_OTHER): Payer: Medicare Other

## 2019-05-28 ENCOUNTER — Encounter: Payer: Self-pay | Admitting: Sports Medicine

## 2019-05-28 ENCOUNTER — Other Ambulatory Visit: Payer: Self-pay | Admitting: Sports Medicine

## 2019-05-28 ENCOUNTER — Ambulatory Visit: Payer: Medicare Other | Admitting: Sports Medicine

## 2019-05-28 DIAGNOSIS — M79672 Pain in left foot: Secondary | ICD-10-CM

## 2019-05-28 DIAGNOSIS — U071 COVID-19: Secondary | ICD-10-CM

## 2019-05-28 DIAGNOSIS — L97529 Non-pressure chronic ulcer of other part of left foot with unspecified severity: Secondary | ICD-10-CM

## 2019-05-28 DIAGNOSIS — R238 Other skin changes: Secondary | ICD-10-CM

## 2019-05-28 DIAGNOSIS — I739 Peripheral vascular disease, unspecified: Secondary | ICD-10-CM

## 2019-05-28 DIAGNOSIS — M79675 Pain in left toe(s): Secondary | ICD-10-CM | POA: Diagnosis not present

## 2019-05-28 NOTE — Progress Notes (Signed)
Subjective: Timothy Burns is a 79 y.o. male patient seen in office for evaluation of ulceration of the left great toe that has been present over the last 3 weeks reports that the area is very tender area burns and reports that he started having issues with wounds on his toes after being diagnosed with Covid.  Patient reports that there is some discoloration and slight swelling and has been followed closely by PCP who has prescribed Bactroban cream for him to use to the toe and Keflex without any improvement patient reports that he will complete his Keflex in 2 days. Denies nausea/fever/vomiting/chills/night sweats/shortness of breath/chest pain.  Patient has no other pedal complaints at this time.  Patient is assisted by son at this visit.  Review of Systems  All other systems reviewed and are negative.    Patient Active Problem List   Diagnosis Date Noted  . Magnesium deficiency 12/31/2018  . Hypokalemia 12/20/2018  . Hypomagnesemia 12/20/2018  . Hypocalcemia 12/20/2018  . QT prolongation 12/20/2018  . Metabolic acidosis 26/94/8546  . Hypercarbia 12/17/2018  . Hypoxia 12/15/2018  . Intention tremor 12/15/2018  . Acute infective gastroenteritis 11/25/2018  . Generalized weakness 11/24/2018  . Chronic kidney failure, stage 3 (moderate) 11/24/2018  . Colostomy in place [facility]) 11/14/2018  . COVID-19 11/14/2018  . Left elbow pain 11/14/2018  . Syncope 11/14/2018  . Acute kidney injury ([place]) 10/08/2018  . Hyperphosphatemia 04/14/2018  . Vitamin D deficiency 01/22/2018  . Senile dementia without behavioral disturbance ([place]) 01/09/2017  . Mood disorder ([place]) 01/09/2017  . Skin yeast infection 12/27/2016  . Chronic congestive heart failure ([place]) 11/07/2016  . Short gut syndrome 10/02/2016  . Abscess of intestine 10/02/2016  . Chronic bronchitis ([place]) 10/02/2016  . Anemia 10/02/2016  . At risk for falls 10/02/2016  . Body mass index (BMI) less than 19 10/02/2016  . Chronic insomnia  10/02/2016  . Pneumonia of left lower lobe due to infectious organism 10/02/2016  . 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 Functional diarrhea 10/02/2016  . History of colon cancer 10/02/2016  . History of depression 10/02/2016  . Chronic back pain 10/02/2016  . Malaise and fatigue 10/02/2016  . Medicare annual wellness visit, subsequent 10/02/2016  . Orthostatic hypotension 10/02/2016  . Other abnormal glucose 10/02/2016  . Other and unspecified noninfectious gastroenteritis and colitis 10/02/2016  . Premature beats 10/02/2016  . Skin lesions 10/02/2016  . Underweight 10/02/2016  . Vomiting 10/02/2016   Current Outpatient Medications on File Prior to Visit  Medication Sig Dispense Refill  . acetaminophen (TYLENOL) 325 MG tablet Take 2 tablets (650 mg total) by mouth every 6 (six) hours as needed for mild pain or headache (fever >/= 101).    Marland Kitchen albuterol (PROVENTIL HFA;VENTOLIN HFA) 108 (90 Base) MCG/ACT inhaler Inhale 1-2 puffs into the lungs every 6 (six) hours as needed for wheezing or shortness of breath.     . cholestyramine (QUESTRAN) 4 g packet ADD PACKET WITH LIQUID AND DRINK BY MOUTH ONCE DAILY. 30 each 3  . cholestyramine light (PREVALITE) 4 GM/DOSE powder Take 4 g by mouth as needed (diarrhea).     . donepezil (ARICEPT) 10 MG tablet Take 10 mg by mouth at bedtime.     Marland Kitchen escitalopram (LEXAPRO) 10 MG tablet Take 10 mg by mouth daily.     . ferrous sulfate 325 (65 FE) MG tablet Take 325 mg by mouth daily.     . fluticasone (FLONASE) 50 MCG/ACT nasal spray Place 1  spray into both nostrils daily.     . folic acid (FOLVITE) 242 MCG tablet Take 800 mcg by mouth daily.     Marland Kitchen HYDROcodone-acetaminophen (NORCO/VICODIN) 5-325 MG tablet Take 1 tablet by mouth every 6 (six) hours as needed for moderate pain.     Marland Kitchen loperamide (IMODIUM) 2 MG capsule Take 4 mg by mouth daily as needed for diarrhea or loose stools.    . methocarbamol (ROBAXIN) 500 MG tablet Take 500 mg by  mouth every 8 (eight) hours as needed for muscle spasms.     . ondansetron (ZOFRAN-ODT) 4 MG disintegrating tablet Take 1 tablet (4 mg total) by mouth every 6 (six) hours as needed. 30 tablet 6  . OXYGEN Inhale 2 L into the lungs daily as needed (O2 LEVELS).     . promethazine (PHENERGAN) 25 MG tablet Take 1 tablet (25 mg total) by mouth every 8 (eight) hours as needed. 30 tablet 6  . SPIRIVA HANDIHALER 18 MCG inhalation capsule Place 18 mcg into inhaler and inhale daily as needed (shortness of breath).     . Teduglutide, rDNA, (GATTEX) 5 MG KIT Inject 0.12 mLs into the skin daily. Inject 1.2 mg sq daily    . traZODone (DESYREL) 50 MG tablet Take 50 mg by mouth at bedtime as needed for sleep.     . Vitamin D, Ergocalciferol, (DRISDOL) 50000 units CAPS capsule Take 50,000 Units by mouth once a week.      No current facility-administered medications on file prior to visit.   No Known Allergies  No results found for this or any previous visit (from the past 2160 hour(s)).  Objective: There were no vitals filed for this visit.  General: Patient is awake, alert, oriented x 3 and in no acute distress.  Dermatology: Skin is warm and dry bilateral with a ulceration present  Distal tuft of left great toe.  Ulceration measures 1.5cm x 0.8 cm x 0.1cm. There is a  Granular border with a dry eschar base. The ulceration does not  probe to bone. There is no malodor, no active drainage, blanchable erythema with severe purple discoloration to the left great toe, no edema. No other acute signs of infection.   Vascular: Dorsalis Pedis pulse = 1/4 on right 0/4 on left, posterior Tibial pulse = 1/4 on right and 0/4 on the left, capillary Fill Time < 5 seconds  Neurologic: Gross sensation present to light touch with increased hypersensitivity to touch of the left great toe.  Musculosketal: There is pain with palpation to ulcerated area. No pain with compression to calves bilateral.  Mild hallux extensus and  digital deformity noted left greater than right.  Xrays, Left foot:No bony destruction suggestive of osteomyelitis. No gas in soft tissues. Diffuse decreased osseous mineralization consistent with age.  No results for input(s): GRAMSTAIN, LABORGA in the last 8760 hours.  Assessment and Plan:  Problem List Items Addressed This Visit    None    Visit Diagnoses    Toe ulcer, left, with unspecified severity ([place])    -  Primary   PVD (peripheral vascular disease) ([place])       COVID toes       Toe pain, left          -Examined patient and discussed the progression of the wound and treatment alternatives. -Xrays reviewed -No debridement done due to poor vascular status of patient -Cleansed ulceration at left first toe and wound culture was obtained; will call patient if he needs  to have additional antibiotics otherwise advised patient to continue with current antibiotics of Keflex until completed -Applied Iodosorb and stockinet dressing and advised patient to do these same at home daily -Dispensed postoperative shoe for patient to wear to prevent rubbing at first toe -Advised patient when sleeping at bedtime to prevent rubbing from the covers or anything hitting the toe -Patient is scheduled to see vascular for follow-up has already had ABI testing which was abnormal on the left - Advised patient to go to the ER or return to office if the wound worsens or if constitutional symptoms are present. -Patient to return to office in 2 weeks for follow up care and evaluation or sooner if problems arise.  Landis Martins, DPM

## 2019-05-28 NOTE — Addendum Note (Signed)
Addended by: Allean Found on: 05/28/2019 04:17 PM   Modules accepted: Orders

## 2019-05-29 ENCOUNTER — Other Ambulatory Visit: Payer: Self-pay | Admitting: Sports Medicine

## 2019-05-29 DIAGNOSIS — L97529 Non-pressure chronic ulcer of other part of left foot with unspecified severity: Secondary | ICD-10-CM

## 2019-05-29 DIAGNOSIS — U071 COVID-19: Secondary | ICD-10-CM

## 2019-05-31 LAB — WOUND CULTURE: Organism ID, Bacteria: NONE SEEN

## 2019-06-01 ENCOUNTER — Other Ambulatory Visit: Payer: Self-pay | Admitting: Sports Medicine

## 2019-06-01 ENCOUNTER — Telehealth: Payer: Self-pay

## 2019-06-01 MED ORDER — SULFAMETHOXAZOLE-TRIMETHOPRIM 400-80 MG PO TABS: 1.0000 | ORAL_TABLET | Freq: Two times a day (BID) | ORAL | 0 refills | Status: AC

## 2019-06-01 NOTE — Telephone Encounter (Signed)
Called and spoke with pt about wound cx results and the prescription sent to his pharmacy. Pt states understanding

## 2019-06-01 NOTE — Progress Notes (Signed)
Bactrim sent to pharmacy for + culture

## 2019-06-01 NOTE — Telephone Encounter (Signed)
-----   Message from Landis Martins, [place] sent at 06/01/2019 10:11 AM EST ----- Please let patient know that Bactrim was sent to pharmacy for + culture -Dr. Chauncey Cruel

## 2019-06-10 ENCOUNTER — Ambulatory Visit: Payer: Medicare Other | Admitting: Sports Medicine

## 2019-06-11 ENCOUNTER — Ambulatory Visit: Payer: Medicare Other | Admitting: Sports Medicine

## 2019-06-29 ENCOUNTER — Other Ambulatory Visit: Payer: Self-pay | Admitting: Gastroenterology

## 2019-07-01 NOTE — Telephone Encounter (Signed)
Called and spoke with contacted [facility] @ [phone]; she reports she will call and speak with the patient's daughter concerning the copay assistance; she reports the patient has already received copay assistance from [employer] for the entire year of 2021;

## 2019-07-01 NOTE — Telephone Encounter (Signed)
Pt's daughter Butch Penny stated that [facility] requires PA for Gattex.

## 2019-07-02 NOTE — Telephone Encounter (Signed)
Pt's daughter Butch Penny called back to discuss PA for Gattex.

## 2019-07-03 NOTE — Telephone Encounter (Signed)
Butch Penny also aware that PA for Antony Madura has been approved for the calendar year 2021

## 2019-07-03 NOTE — Telephone Encounter (Signed)
Left message for Timothy Burns to call back to the office;

## 2019-07-03 NOTE — Telephone Encounter (Signed)
Patient's daughter-Donna-returned call to the office-verified DPR-Donna given information and is agreeable to plan of care; Butch Penny verbalized understanding of information/instructions;   Butch Penny advised to call back to the office at [phone] should questions/concerns arise;

## 2019-07-06 NOTE — Telephone Encounter (Signed)
Pt's daughter Butch Penny would like to speak with you about this medication that she is having issues with. Her phone is [phone].

## 2019-07-07 NOTE — Telephone Encounter (Signed)
Left message for Butch Penny, patient's daughter, to call back to the office;

## 2019-09-25 ENCOUNTER — Telehealth: Payer: Self-pay | Admitting: Gastroenterology

## 2019-09-25 NOTE — Telephone Encounter (Signed)
[facility] pharmacist called and stated that she had faxed a refill request for Gattex.  She will resend the request.  Please fax back to [phone].

## 2019-12-03 ENCOUNTER — Other Ambulatory Visit: Payer: Self-pay | Admitting: Gastroenterology

## 2019-12-03 ENCOUNTER — Other Ambulatory Visit: Payer: Self-pay

## 2019-12-03 NOTE — Progress Notes (Signed)
Erroneous encounter

## 2020-03-11 ENCOUNTER — Other Ambulatory Visit: Payer: Self-pay | Admitting: Gastroenterology

## 2020-04-05 DIAGNOSIS — E876 Hypokalemia: Secondary | ICD-10-CM

## 2020-04-06 DIAGNOSIS — E876 Hypokalemia: Secondary | ICD-10-CM | POA: Diagnosis not present

## 2020-04-26 ENCOUNTER — Other Ambulatory Visit: Payer: Self-pay | Admitting: Gastroenterology

## 2020-05-09 DIAGNOSIS — A419 Sepsis, unspecified organism: Secondary | ICD-10-CM

## 2020-06-07 DEATH — deceased

## 2020-11-23 IMAGING — CT CT ABDOMEN AND PELVIS WITHOUT CONTRAST
2 of 4 series · 16 of 46 positions shown, 18 images · non-contrast
Comparison: CT 02/23/2018

CLINICAL DATA: Low hemoglobin labs COPD, CKD stage IV, history of
colon cancer status post hemicolectomy and current ileostomyDiarrhea

EXAM:
CT ABDOMEN AND PELVIS WITHOUT CONTRAST
TECHNIQUE: Multidetector CT imaging of the abdomen and pelvis was performed
following the standard protocol without IV contrast.

[Series 3: abd/ pelvis 5.0 i30f 2 · axial · 0.77mm/px · z∈[+907,+1272]mm · 13 of 81 slices shown, 15 images]
[im 4/81  soft-tissue]
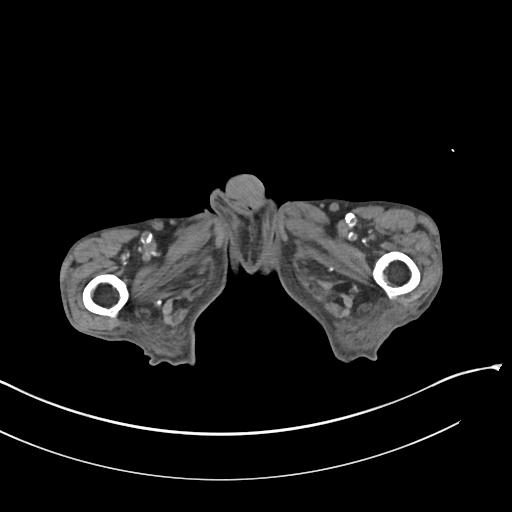
[im 4/81  bone]
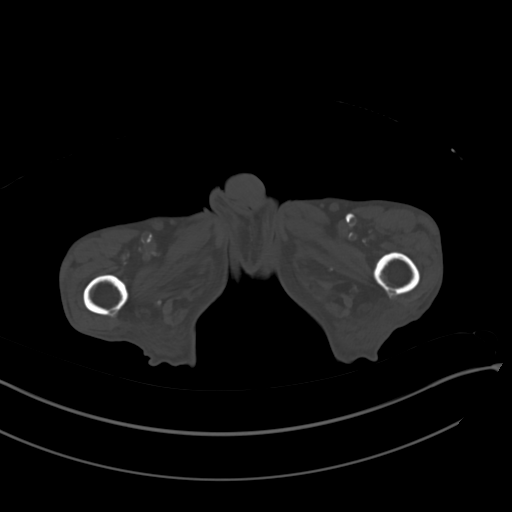
[im 11/81  soft-tissue]
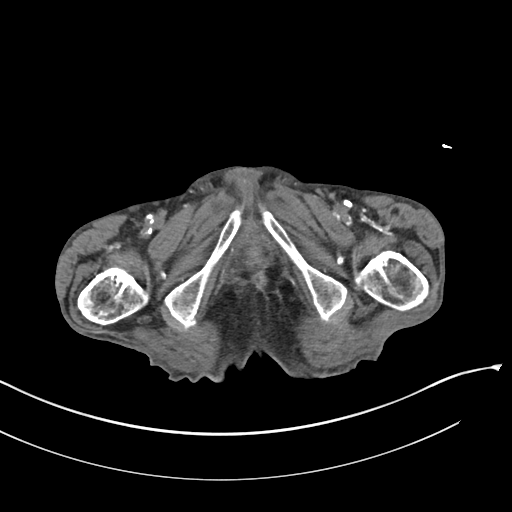
[im 17/81  soft-tissue]
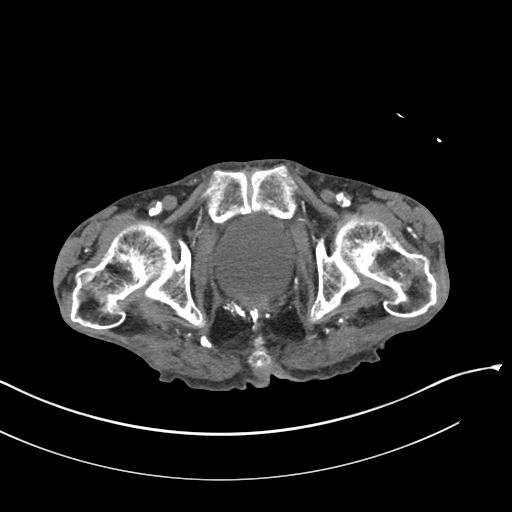
[im 24/81  soft-tissue]
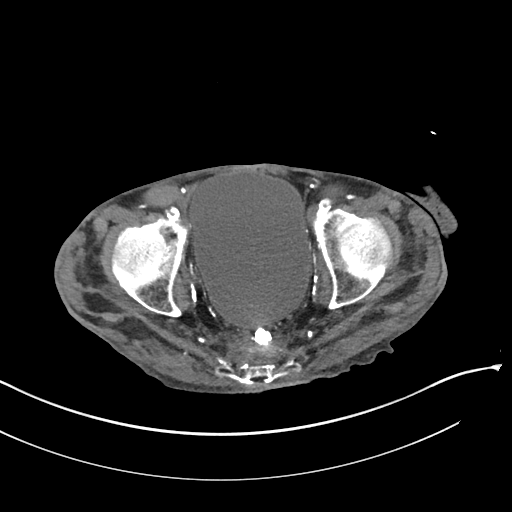
[im 27/81  soft-tissue]
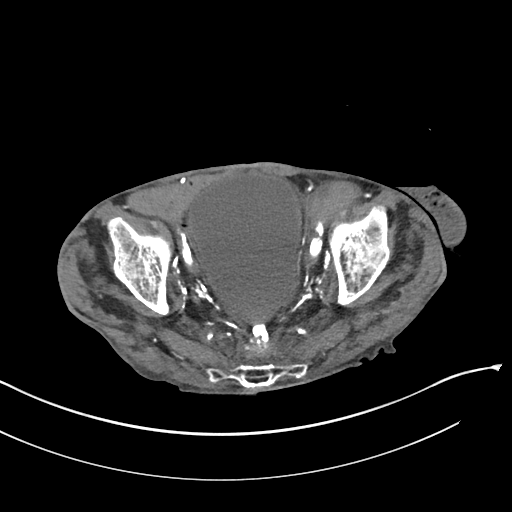
[im 34/81  soft-tissue]
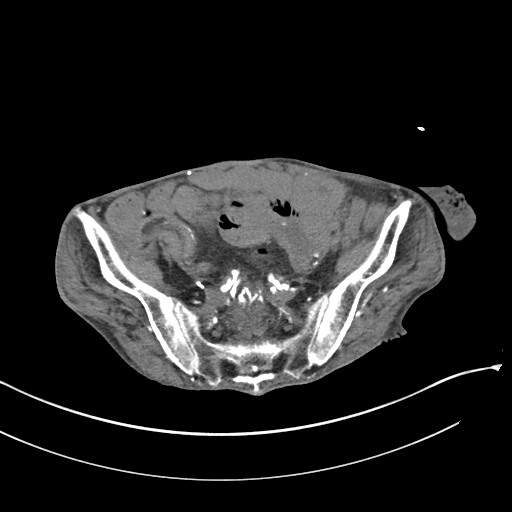
[im 41/81  soft-tissue]
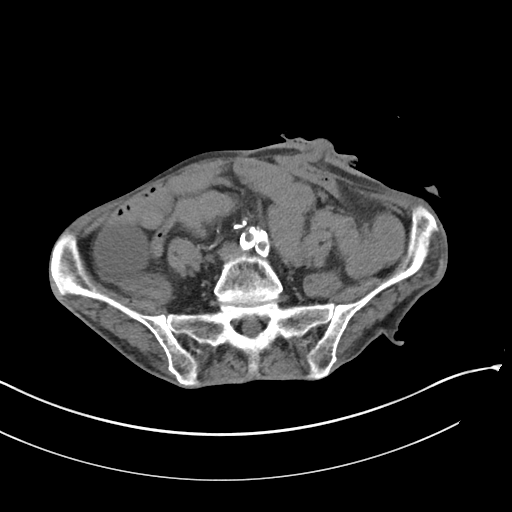
[im 47/81  soft-tissue]
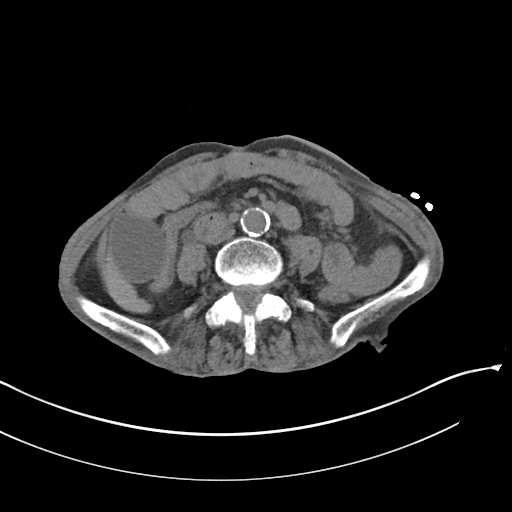
[im 54/81  soft-tissue]
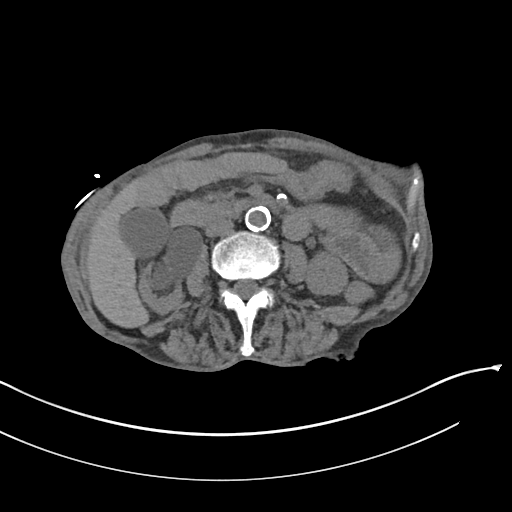
[im 54/81  bone]
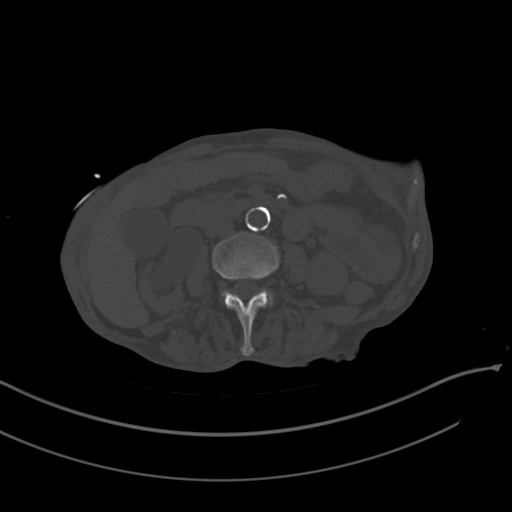
[im 57/81  soft-tissue]
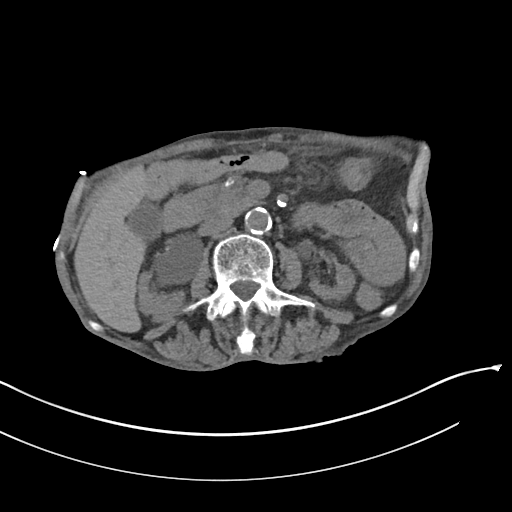
[im 64/81  soft-tissue]
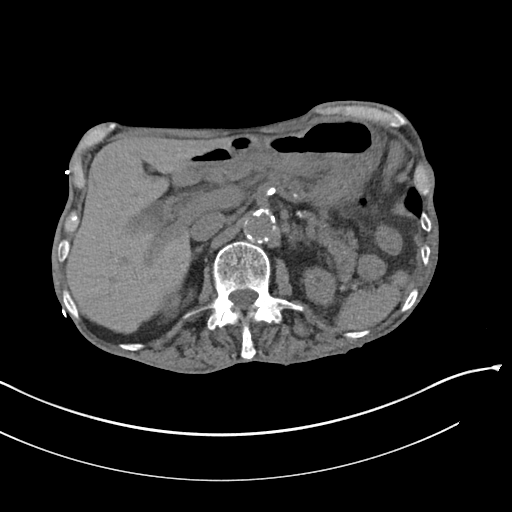
[im 71/81  soft-tissue]
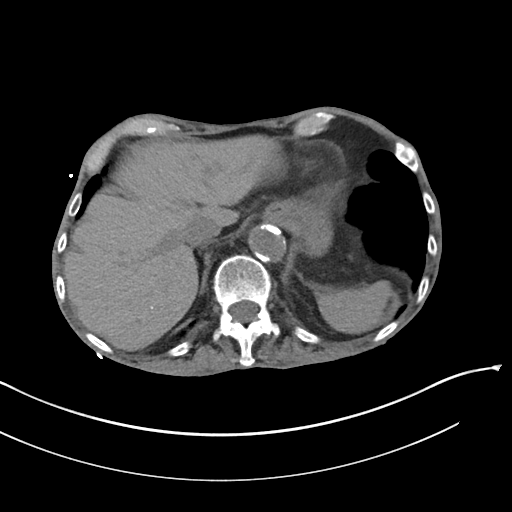
[im 77/81  soft-tissue]
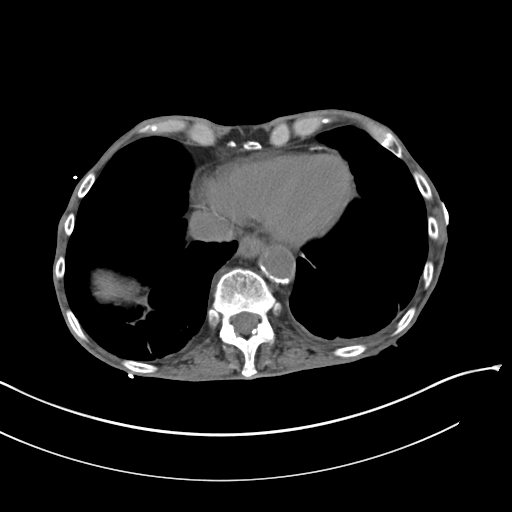

[Series 6: cor st · coronal · 0.72mm/px · 3 of 86 slices shown]
[im 29/86  soft-tissue]
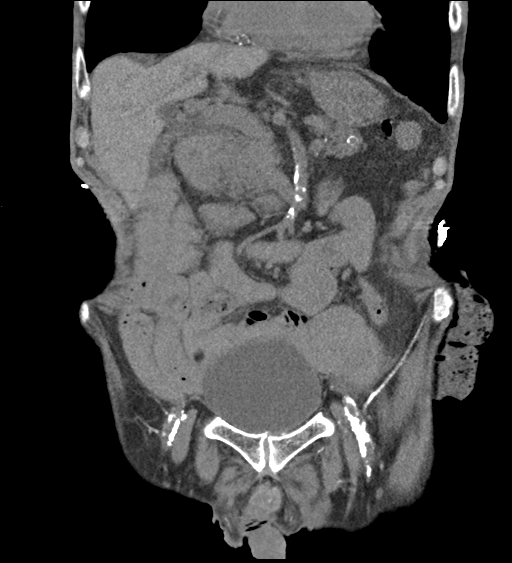
[im 38/86  soft-tissue]
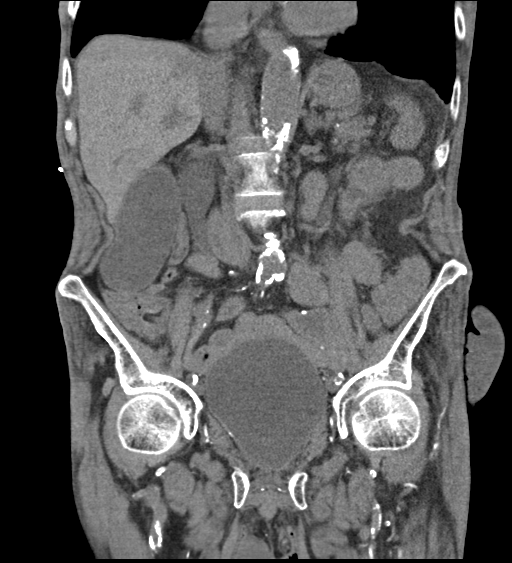
[im 48/86  soft-tissue]
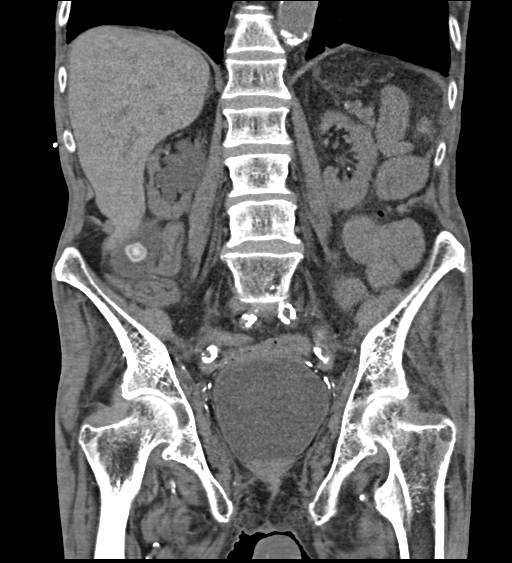

[16 of 46 positions shown; findings below may reference images not displayed]

FINDINGS: Lower chest: Bibasilar pleuroparenchymal thickening. No pneumonia or
aspiration pneumonitis. Emphysema noted.

Hepatobiliary: No focal hepatic lesion on noncontrast exam.
Gallbladder is distended 5 cm which is similar to comparison exam.
There is a large gallstone within the lumen the gallbladder. No
evidence of acute inflammation. No biliary duct dilatation evident.

Pancreas: Pancreas is normal. No ductal dilatation. No pancreatic
inflammation.

Spleen: Normal spleen

Adrenals/urinary tract: Adrenal glands normal. The bladder is
distended. There is mild hydronephrosis and hydroureter on the
RIGHT. Mild hydroureter on the LEFT. No obstructing lesion
identified. Nonobstructing calculus in the RIGHT kidney.

Stomach/Bowel: Stomach, duodenum and small-bowel normal. No evidence
of bowel obstruction. There is a enteric colonic anastomosis in the
RIGHT lower quadrant. There is colostomy in the LEFT abdominal wall.
No free air. There is a large diverticulum along the descending
colon (image [DATE]) without acute inflammation.

Vascular/Lymphatic: Abdominal aorta is normal caliber with
atherosclerotic calcification. There is no retroperitoneal or
periportal lymphadenopathy. No pelvic lymphadenopathy.

Reproductive: Prostate small

Other: No free fluid.

Musculoskeletal: No aggressive osseous lesion.
IMPRESSION: 1. Bladder distension and mild bilateral hydroureter suggest bladder
outlet obstruction.
2. Partial colectomy with LEFT lower quadrant ostomy. No evidence of
bowel obstruction. Diverticulosis of LEFT colon without evidence
diverticulitis.
3. Distended gallbladder without acute inflammation. Gallstone
noted.
